# Patient Record
Sex: Female | Born: 1945
Health system: Southern US, Community
[De-identification: ages and names within clinical notes are randomized; demographics above are authoritative.]

## PROBLEM LIST (undated history)

## (undated) DIAGNOSIS — Z803 Family history of malignant neoplasm of breast: Secondary | ICD-10-CM

## (undated) DIAGNOSIS — M1990 Unspecified osteoarthritis, unspecified site: Secondary | ICD-10-CM

## (undated) DIAGNOSIS — E785 Hyperlipidemia, unspecified: Secondary | ICD-10-CM

## (undated) DIAGNOSIS — N63 Unspecified lump in unspecified breast: Secondary | ICD-10-CM

## (undated) DIAGNOSIS — D249 Benign neoplasm of unspecified breast: Secondary | ICD-10-CM

## (undated) DIAGNOSIS — N6019 Diffuse cystic mastopathy of unspecified breast: Secondary | ICD-10-CM

## (undated) DIAGNOSIS — T7840XA Allergy, unspecified, initial encounter: Secondary | ICD-10-CM

## (undated) HISTORY — PX: EYE SURGERY: SHX253

## (undated) HISTORY — DX: Diffuse cystic mastopathy of unspecified breast: N60.19

## (undated) HISTORY — DX: Allergy, unspecified, initial encounter: T78.40XA

## (undated) HISTORY — DX: Unspecified lump in unspecified breast: N63.0

## (undated) HISTORY — DX: Hyperlipidemia, unspecified: E78.5

## (undated) HISTORY — DX: Benign neoplasm of unspecified breast: D24.9

## (undated) HISTORY — DX: Unspecified osteoarthritis, unspecified site: M19.90

## (undated) HISTORY — DX: Family history of malignant neoplasm of breast: Z80.3

---

## 1978-05-20 HISTORY — PX: TUBAL LIGATION: SHX77

## 2003-05-21 HISTORY — PX: BREAST EXCISIONAL BIOPSY: SUR124

## 2004-04-17 ENCOUNTER — Ambulatory Visit: Payer: Self-pay | Admitting: General Surgery

## 2005-03-07 ENCOUNTER — Ambulatory Visit: Payer: Self-pay | Admitting: General Surgery

## 2006-03-25 ENCOUNTER — Ambulatory Visit: Payer: Self-pay | Admitting: General Surgery

## 2006-09-18 HISTORY — PX: COLONOSCOPY: SHX174

## 2006-10-17 ENCOUNTER — Ambulatory Visit: Payer: Self-pay | Admitting: General Surgery

## 2007-03-26 ENCOUNTER — Ambulatory Visit: Payer: Self-pay | Admitting: General Surgery

## 2007-05-21 LAB — HM COLONOSCOPY: HM Colonoscopy: NORMAL

## 2008-01-11 ENCOUNTER — Other Ambulatory Visit: Payer: Self-pay

## 2008-01-11 ENCOUNTER — Emergency Department: Payer: Self-pay | Admitting: Emergency Medicine

## 2008-04-19 ENCOUNTER — Ambulatory Visit: Payer: Self-pay | Admitting: General Surgery

## 2008-09-01 DIAGNOSIS — E7800 Pure hypercholesterolemia, unspecified: Secondary | ICD-10-CM

## 2009-04-21 ENCOUNTER — Ambulatory Visit: Payer: Self-pay | Admitting: General Surgery

## 2009-05-20 DIAGNOSIS — D249 Benign neoplasm of unspecified breast: Secondary | ICD-10-CM

## 2009-05-20 HISTORY — DX: Benign neoplasm of unspecified breast: D24.9

## 2009-09-06 ENCOUNTER — Ambulatory Visit: Payer: Self-pay | Admitting: Family Medicine

## 2009-09-06 DIAGNOSIS — J309 Allergic rhinitis, unspecified: Secondary | ICD-10-CM

## 2009-09-15 DIAGNOSIS — D72819 Decreased white blood cell count, unspecified: Secondary | ICD-10-CM

## 2009-12-18 ENCOUNTER — Ambulatory Visit: Payer: Self-pay | Admitting: Internal Medicine

## 2010-01-12 ENCOUNTER — Ambulatory Visit: Payer: Self-pay | Admitting: Internal Medicine

## 2010-01-18 ENCOUNTER — Ambulatory Visit: Payer: Self-pay | Admitting: Internal Medicine

## 2010-04-23 ENCOUNTER — Ambulatory Visit: Payer: Self-pay | Admitting: General Surgery

## 2011-01-31 ENCOUNTER — Emergency Department: Payer: Self-pay | Admitting: Emergency Medicine

## 2011-05-21 HISTORY — PX: BREAST BIOPSY: SHX20

## 2011-06-24 ENCOUNTER — Ambulatory Visit: Payer: Self-pay | Admitting: General Surgery

## 2011-06-24 DIAGNOSIS — Z1231 Encounter for screening mammogram for malignant neoplasm of breast: Secondary | ICD-10-CM | POA: Diagnosis not present

## 2011-07-02 DIAGNOSIS — N63 Unspecified lump in unspecified breast: Secondary | ICD-10-CM | POA: Diagnosis not present

## 2011-07-02 DIAGNOSIS — Z803 Family history of malignant neoplasm of breast: Secondary | ICD-10-CM | POA: Diagnosis not present

## 2011-07-02 DIAGNOSIS — N6009 Solitary cyst of unspecified breast: Secondary | ICD-10-CM | POA: Diagnosis not present

## 2011-07-02 DIAGNOSIS — N6019 Diffuse cystic mastopathy of unspecified breast: Secondary | ICD-10-CM | POA: Diagnosis not present

## 2011-09-03 ENCOUNTER — Ambulatory Visit: Payer: Self-pay | Admitting: Family Medicine

## 2011-09-03 DIAGNOSIS — N63 Unspecified lump in unspecified breast: Secondary | ICD-10-CM | POA: Diagnosis not present

## 2011-09-03 DIAGNOSIS — M25869 Other specified joint disorders, unspecified knee: Secondary | ICD-10-CM | POA: Diagnosis not present

## 2011-09-03 DIAGNOSIS — M5030 Other cervical disc degeneration, unspecified cervical region: Secondary | ICD-10-CM | POA: Diagnosis not present

## 2011-09-03 DIAGNOSIS — H43399 Other vitreous opacities, unspecified eye: Secondary | ICD-10-CM | POA: Diagnosis not present

## 2011-09-03 DIAGNOSIS — R42 Dizziness and giddiness: Secondary | ICD-10-CM | POA: Diagnosis not present

## 2011-09-03 DIAGNOSIS — M12269 Villonodular synovitis (pigmented), unspecified knee: Secondary | ICD-10-CM | POA: Diagnosis not present

## 2011-09-03 DIAGNOSIS — M898X9 Other specified disorders of bone, unspecified site: Secondary | ICD-10-CM | POA: Diagnosis not present

## 2011-09-03 DIAGNOSIS — M1710 Unilateral primary osteoarthritis, unspecified knee: Secondary | ICD-10-CM | POA: Diagnosis not present

## 2011-09-03 DIAGNOSIS — M25569 Pain in unspecified knee: Secondary | ICD-10-CM | POA: Diagnosis not present

## 2011-09-11 DIAGNOSIS — M1710 Unilateral primary osteoarthritis, unspecified knee: Secondary | ICD-10-CM | POA: Diagnosis not present

## 2011-09-11 DIAGNOSIS — M238X9 Other internal derangements of unspecified knee: Secondary | ICD-10-CM | POA: Diagnosis not present

## 2011-09-11 DIAGNOSIS — M25569 Pain in unspecified knee: Secondary | ICD-10-CM | POA: Diagnosis not present

## 2011-09-11 DIAGNOSIS — IMO0002 Reserved for concepts with insufficient information to code with codable children: Secondary | ICD-10-CM | POA: Diagnosis not present

## 2011-09-11 DIAGNOSIS — M6580 Other synovitis and tenosynovitis, unspecified site: Secondary | ICD-10-CM | POA: Diagnosis not present

## 2011-09-17 DIAGNOSIS — J019 Acute sinusitis, unspecified: Secondary | ICD-10-CM | POA: Diagnosis not present

## 2011-10-02 DIAGNOSIS — M238X9 Other internal derangements of unspecified knee: Secondary | ICD-10-CM | POA: Diagnosis not present

## 2011-10-02 DIAGNOSIS — IMO0002 Reserved for concepts with insufficient information to code with codable children: Secondary | ICD-10-CM | POA: Diagnosis not present

## 2011-10-02 DIAGNOSIS — M1710 Unilateral primary osteoarthritis, unspecified knee: Secondary | ICD-10-CM | POA: Diagnosis not present

## 2011-10-02 DIAGNOSIS — M6580 Other synovitis and tenosynovitis, unspecified site: Secondary | ICD-10-CM | POA: Diagnosis not present

## 2011-10-02 DIAGNOSIS — M25569 Pain in unspecified knee: Secondary | ICD-10-CM | POA: Diagnosis not present

## 2011-11-26 DIAGNOSIS — R42 Dizziness and giddiness: Secondary | ICD-10-CM | POA: Diagnosis not present

## 2011-11-26 DIAGNOSIS — H43399 Other vitreous opacities, unspecified eye: Secondary | ICD-10-CM | POA: Diagnosis not present

## 2011-11-26 DIAGNOSIS — E7800 Pure hypercholesterolemia, unspecified: Secondary | ICD-10-CM | POA: Diagnosis not present

## 2011-11-26 DIAGNOSIS — Z0000 Encounter for general adult medical examination without abnormal findings: Secondary | ICD-10-CM | POA: Diagnosis not present

## 2011-11-26 DIAGNOSIS — Z01419 Encounter for gynecological examination (general) (routine) without abnormal findings: Secondary | ICD-10-CM | POA: Diagnosis not present

## 2011-11-26 DIAGNOSIS — R61 Generalized hyperhidrosis: Secondary | ICD-10-CM | POA: Diagnosis not present

## 2011-11-26 DIAGNOSIS — Z23 Encounter for immunization: Secondary | ICD-10-CM | POA: Diagnosis not present

## 2011-11-26 LAB — HM PAP SMEAR: HM PAP: NEGATIVE

## 2011-12-05 DIAGNOSIS — T148 Other injury of unspecified body region: Secondary | ICD-10-CM | POA: Diagnosis not present

## 2011-12-05 DIAGNOSIS — R42 Dizziness and giddiness: Secondary | ICD-10-CM | POA: Diagnosis not present

## 2011-12-05 DIAGNOSIS — H43399 Other vitreous opacities, unspecified eye: Secondary | ICD-10-CM | POA: Diagnosis not present

## 2011-12-05 DIAGNOSIS — Z23 Encounter for immunization: Secondary | ICD-10-CM | POA: Diagnosis not present

## 2011-12-05 DIAGNOSIS — W57XXXA Bitten or stung by nonvenomous insect and other nonvenomous arthropods, initial encounter: Secondary | ICD-10-CM | POA: Diagnosis not present

## 2012-01-06 DIAGNOSIS — Z803 Family history of malignant neoplasm of breast: Secondary | ICD-10-CM | POA: Diagnosis not present

## 2012-01-06 DIAGNOSIS — N63 Unspecified lump in unspecified breast: Secondary | ICD-10-CM | POA: Diagnosis not present

## 2012-02-11 DIAGNOSIS — Z23 Encounter for immunization: Secondary | ICD-10-CM | POA: Diagnosis not present

## 2012-03-25 DIAGNOSIS — J019 Acute sinusitis, unspecified: Secondary | ICD-10-CM | POA: Diagnosis not present

## 2012-03-25 DIAGNOSIS — M7150 Other bursitis, not elsewhere classified, unspecified site: Secondary | ICD-10-CM | POA: Diagnosis not present

## 2012-03-25 DIAGNOSIS — M19019 Primary osteoarthritis, unspecified shoulder: Secondary | ICD-10-CM | POA: Diagnosis not present

## 2012-03-25 DIAGNOSIS — M25519 Pain in unspecified shoulder: Secondary | ICD-10-CM | POA: Diagnosis not present

## 2012-05-18 DIAGNOSIS — R209 Unspecified disturbances of skin sensation: Secondary | ICD-10-CM | POA: Diagnosis not present

## 2012-05-18 DIAGNOSIS — R61 Generalized hyperhidrosis: Secondary | ICD-10-CM | POA: Diagnosis not present

## 2012-05-18 DIAGNOSIS — R42 Dizziness and giddiness: Secondary | ICD-10-CM | POA: Diagnosis not present

## 2012-05-18 DIAGNOSIS — H43399 Other vitreous opacities, unspecified eye: Secondary | ICD-10-CM | POA: Diagnosis not present

## 2012-05-25 DIAGNOSIS — M25529 Pain in unspecified elbow: Secondary | ICD-10-CM | POA: Diagnosis not present

## 2012-05-25 DIAGNOSIS — R209 Unspecified disturbances of skin sensation: Secondary | ICD-10-CM | POA: Diagnosis not present

## 2012-05-25 DIAGNOSIS — M1990 Unspecified osteoarthritis, unspecified site: Secondary | ICD-10-CM | POA: Diagnosis not present

## 2012-05-25 DIAGNOSIS — M719 Bursopathy, unspecified: Secondary | ICD-10-CM | POA: Diagnosis not present

## 2012-05-25 DIAGNOSIS — M67919 Unspecified disorder of synovium and tendon, unspecified shoulder: Secondary | ICD-10-CM | POA: Diagnosis not present

## 2012-05-26 DIAGNOSIS — R209 Unspecified disturbances of skin sensation: Secondary | ICD-10-CM | POA: Diagnosis not present

## 2012-05-26 DIAGNOSIS — M79609 Pain in unspecified limb: Secondary | ICD-10-CM | POA: Diagnosis not present

## 2012-06-24 ENCOUNTER — Ambulatory Visit: Payer: Self-pay | Admitting: General Surgery

## 2012-06-24 DIAGNOSIS — Z1231 Encounter for screening mammogram for malignant neoplasm of breast: Secondary | ICD-10-CM | POA: Diagnosis not present

## 2012-06-24 DIAGNOSIS — K5289 Other specified noninfective gastroenteritis and colitis: Secondary | ICD-10-CM | POA: Diagnosis not present

## 2012-07-08 DIAGNOSIS — Z803 Family history of malignant neoplasm of breast: Secondary | ICD-10-CM | POA: Diagnosis not present

## 2012-09-14 DIAGNOSIS — M1710 Unilateral primary osteoarthritis, unspecified knee: Secondary | ICD-10-CM | POA: Diagnosis not present

## 2012-11-23 ENCOUNTER — Encounter: Payer: Self-pay | Admitting: *Deleted

## 2012-11-27 DIAGNOSIS — D72819 Decreased white blood cell count, unspecified: Secondary | ICD-10-CM | POA: Diagnosis not present

## 2012-11-27 DIAGNOSIS — Z1159 Encounter for screening for other viral diseases: Secondary | ICD-10-CM | POA: Diagnosis not present

## 2012-11-27 DIAGNOSIS — Z0000 Encounter for general adult medical examination without abnormal findings: Secondary | ICD-10-CM | POA: Diagnosis not present

## 2012-11-27 DIAGNOSIS — E669 Obesity, unspecified: Secondary | ICD-10-CM | POA: Diagnosis not present

## 2012-11-27 DIAGNOSIS — R319 Hematuria, unspecified: Secondary | ICD-10-CM | POA: Diagnosis not present

## 2012-11-27 DIAGNOSIS — M1710 Unilateral primary osteoarthritis, unspecified knee: Secondary | ICD-10-CM | POA: Diagnosis not present

## 2012-11-27 DIAGNOSIS — E7800 Pure hypercholesterolemia, unspecified: Secondary | ICD-10-CM | POA: Diagnosis not present

## 2012-12-03 ENCOUNTER — Ambulatory Visit: Payer: Self-pay | Admitting: Family Medicine

## 2012-12-03 DIAGNOSIS — M899 Disorder of bone, unspecified: Secondary | ICD-10-CM | POA: Diagnosis not present

## 2012-12-03 DIAGNOSIS — M949 Disorder of cartilage, unspecified: Secondary | ICD-10-CM | POA: Diagnosis not present

## 2012-12-03 LAB — HM DEXA SCAN

## 2013-02-04 DIAGNOSIS — Z23 Encounter for immunization: Secondary | ICD-10-CM | POA: Diagnosis not present

## 2013-05-03 LAB — HM MAMMOGRAPHY

## 2013-06-30 ENCOUNTER — Ambulatory Visit: Payer: Self-pay | Admitting: General Surgery

## 2013-06-30 ENCOUNTER — Encounter: Payer: Self-pay | Admitting: General Surgery

## 2013-06-30 DIAGNOSIS — Z1231 Encounter for screening mammogram for malignant neoplasm of breast: Secondary | ICD-10-CM | POA: Diagnosis not present

## 2013-06-30 DIAGNOSIS — R922 Inconclusive mammogram: Secondary | ICD-10-CM | POA: Diagnosis not present

## 2013-07-07 ENCOUNTER — Encounter: Payer: Self-pay | Admitting: General Surgery

## 2013-07-07 ENCOUNTER — Ambulatory Visit (INDEPENDENT_AMBULATORY_CARE_PROVIDER_SITE_OTHER): Payer: Medicare Other | Admitting: General Surgery

## 2013-07-07 VITALS — BP 130/68 | HR 70 | Resp 12 | Ht 64.5 in | Wt 193.0 lb

## 2013-07-07 DIAGNOSIS — Z803 Family history of malignant neoplasm of breast: Secondary | ICD-10-CM | POA: Diagnosis not present

## 2013-07-07 DIAGNOSIS — N6019 Diffuse cystic mastopathy of unspecified breast: Secondary | ICD-10-CM | POA: Diagnosis not present

## 2013-07-07 NOTE — Progress Notes (Signed)
Patient ID: Sonya Wade, female   DOB: [birth year], 68 y.o.   MRN: [id number]  Chief Complaint  Patient presents with  . Follow-up    mammogram    HPI Sonya Wade is a 68 y.o. female.  who presents for anual breast evaluation. The most recent mammogram was done on 06-30-13. She has a known history of fibrocystic breast disease. Patient does perform regular self breast checks and gets regular mammograms done.  No new breast issues.  HPI  Past Medical History  Diagnosis Date  . Arthritis   . Benign neoplasm of breast 2011  . Diffuse cystic mastopathy   . Lump or mass in breast   . Special screening for malignant neoplasms, colon   . Obesity, unspecified   . Family history of malignant neoplasm of breast   . Hyperlipidemia   . Sinus problem     Past Surgical History  Procedure Laterality Date  . Tubal ligation  1980  . Colonoscopy  May 2008    Dr. Jamal Collin  . Breast biopsy Left 2013    core, apocine cyst  . Breast biopsy Left 2005    intraductal papilloma    Family History  Problem Relation Age of Onset  . Cancer Mother     breast    Social History History  Substance Use Topics  . Smoking status: Never Smoker   . Smokeless tobacco: Not on file  . Alcohol Use: No    No Known Allergies  Current Outpatient Prescriptions  Medication Sig Dispense Refill  . aspirin 81 MG tablet Take 81 mg by mouth daily.      Marland Kitchen Bioflavonoid Products (BIOFLEX PO) Take 2 tablets by mouth daily.      . Multiple Vitamin (MULTIVITAMIN) tablet Take 1 tablet by mouth daily.      . Omega-3 Fatty Acids (FISH OIL) 1000 MG CAPS Take by mouth daily.       No current facility-administered medications for this visit.    Review of Systems Review of Systems  Constitutional: Negative.   Respiratory: Negative.   Cardiovascular: Negative.     Blood pressure 130/68, pulse 70, resp. rate 12, height 5' 4.5" (1.638 m), weight 193 lb (87.544 kg).  Physical Exam Physical Exam   Constitutional: She is oriented to person, place, and time. She appears well-developed and well-nourished.  Eyes: No scleral icterus.  Neck: Neck supple.  Cardiovascular: Normal rate, regular rhythm and normal heart sounds.   Pulmonary/Chest: Effort normal and breath sounds normal. Right breast exhibits no inverted nipple, no mass, no nipple discharge, no skin change and no tenderness. Left breast exhibits no inverted nipple, no mass, no nipple discharge, no skin change and no tenderness.  Lymphadenopathy:    She has no cervical adenopathy.    She has no axillary adenopathy.  Neurological: She is alert and oriented to person, place, and time.  Skin: Skin is warm.    Data Reviewed Mammogram reviewed and stable.  Assessment    Stable physical exam.    Plan    Follow up ion one year with bilateral screening mammogram and office visit.        SANKAR,SEEPLAPUTHUR G 07/07/2013, 11:09 AM

## 2013-08-28 DIAGNOSIS — R209 Unspecified disturbances of skin sensation: Secondary | ICD-10-CM | POA: Diagnosis not present

## 2013-08-28 DIAGNOSIS — Z8249 Family history of ischemic heart disease and other diseases of the circulatory system: Secondary | ICD-10-CM | POA: Diagnosis not present

## 2013-08-28 DIAGNOSIS — H538 Other visual disturbances: Secondary | ICD-10-CM | POA: Diagnosis not present

## 2013-08-28 DIAGNOSIS — I498 Other specified cardiac arrhythmias: Secondary | ICD-10-CM | POA: Diagnosis not present

## 2013-08-28 DIAGNOSIS — N179 Acute kidney failure, unspecified: Secondary | ICD-10-CM | POA: Diagnosis not present

## 2013-08-28 DIAGNOSIS — Z7982 Long term (current) use of aspirin: Secondary | ICD-10-CM | POA: Diagnosis not present

## 2013-08-28 DIAGNOSIS — R0602 Shortness of breath: Secondary | ICD-10-CM | POA: Diagnosis not present

## 2013-08-28 DIAGNOSIS — E876 Hypokalemia: Secondary | ICD-10-CM | POA: Diagnosis not present

## 2013-08-28 DIAGNOSIS — R002 Palpitations: Secondary | ICD-10-CM | POA: Diagnosis not present

## 2013-08-28 DIAGNOSIS — F411 Generalized anxiety disorder: Secondary | ICD-10-CM | POA: Diagnosis not present

## 2013-08-28 DIAGNOSIS — E785 Hyperlipidemia, unspecified: Secondary | ICD-10-CM | POA: Diagnosis not present

## 2013-08-28 DIAGNOSIS — N289 Disorder of kidney and ureter, unspecified: Secondary | ICD-10-CM | POA: Diagnosis not present

## 2013-08-28 DIAGNOSIS — Z79899 Other long term (current) drug therapy: Secondary | ICD-10-CM | POA: Diagnosis not present

## 2013-08-28 DIAGNOSIS — R072 Precordial pain: Secondary | ICD-10-CM | POA: Diagnosis not present

## 2013-08-28 DIAGNOSIS — R079 Chest pain, unspecified: Secondary | ICD-10-CM | POA: Diagnosis not present

## 2013-08-28 DIAGNOSIS — I200 Unstable angina: Secondary | ICD-10-CM | POA: Diagnosis not present

## 2013-08-28 DIAGNOSIS — R52 Pain, unspecified: Secondary | ICD-10-CM | POA: Diagnosis not present

## 2013-08-28 LAB — BASIC METABOLIC PANEL
Anion Gap: 5 — ABNORMAL LOW (ref 7–16)
BUN: 25 mg/dL — ABNORMAL HIGH (ref 7–18)
CHLORIDE: 109 mmol/L — AB (ref 98–107)
CO2: 27 mmol/L (ref 21–32)
CREATININE: 1.43 mg/dL — AB (ref 0.60–1.30)
Calcium, Total: 8.9 mg/dL (ref 8.5–10.1)
EGFR (African American): 44 — ABNORMAL LOW
GFR CALC NON AF AMER: 38 — AB
Glucose: 135 mg/dL — ABNORMAL HIGH (ref 65–99)
Osmolality: 288 (ref 275–301)
Potassium: 3.2 mmol/L — ABNORMAL LOW (ref 3.5–5.1)
Sodium: 141 mmol/L (ref 136–145)

## 2013-08-28 LAB — CBC
HCT: 41.7 % (ref 35.0–47.0)
HGB: 13.3 g/dL (ref 12.0–16.0)
MCH: 26 pg (ref 26.0–34.0)
MCHC: 32 g/dL (ref 32.0–36.0)
MCV: 81 fL (ref 80–100)
Platelet: 208 10*3/uL (ref 150–440)
RBC: 5.13 10*6/uL (ref 3.80–5.20)
RDW: 14 % (ref 11.5–14.5)
WBC: 6.2 10*3/uL (ref 3.6–11.0)

## 2013-08-28 LAB — TROPONIN I: TROPONIN-I: 0.05 ng/mL

## 2013-08-29 ENCOUNTER — Observation Stay: Payer: Self-pay | Admitting: Student

## 2013-08-29 DIAGNOSIS — R079 Chest pain, unspecified: Secondary | ICD-10-CM | POA: Diagnosis not present

## 2013-08-29 DIAGNOSIS — R52 Pain, unspecified: Secondary | ICD-10-CM | POA: Diagnosis not present

## 2013-08-29 DIAGNOSIS — E876 Hypokalemia: Secondary | ICD-10-CM | POA: Diagnosis not present

## 2013-08-29 DIAGNOSIS — F411 Generalized anxiety disorder: Secondary | ICD-10-CM | POA: Diagnosis not present

## 2013-08-29 DIAGNOSIS — R072 Precordial pain: Secondary | ICD-10-CM | POA: Diagnosis not present

## 2013-08-29 DIAGNOSIS — I219 Acute myocardial infarction, unspecified: Secondary | ICD-10-CM | POA: Diagnosis not present

## 2013-08-29 DIAGNOSIS — N179 Acute kidney failure, unspecified: Secondary | ICD-10-CM | POA: Diagnosis not present

## 2013-08-29 DIAGNOSIS — R002 Palpitations: Secondary | ICD-10-CM | POA: Diagnosis not present

## 2013-08-29 DIAGNOSIS — N289 Disorder of kidney and ureter, unspecified: Secondary | ICD-10-CM | POA: Diagnosis not present

## 2013-08-29 DIAGNOSIS — R209 Unspecified disturbances of skin sensation: Secondary | ICD-10-CM | POA: Diagnosis not present

## 2013-08-29 LAB — CK-MB
CK-MB: 1.1 ng/mL (ref 0.5–3.6)
CK-MB: 1.3 ng/mL (ref 0.5–3.6)
CK-MB: 1.5 ng/mL (ref 0.5–3.6)

## 2013-08-29 LAB — CBC WITH DIFFERENTIAL/PLATELET
BASOS ABS: 0.1 10*3/uL (ref 0.0–0.1)
Basophil %: 1.1 %
EOS PCT: 1.3 %
Eosinophil #: 0.1 10*3/uL (ref 0.0–0.7)
HCT: 40 % (ref 35.0–47.0)
HGB: 12.8 g/dL (ref 12.0–16.0)
Lymphocyte #: 1.7 10*3/uL (ref 1.0–3.6)
Lymphocyte %: 33.4 %
MCH: 26.3 pg (ref 26.0–34.0)
MCHC: 31.9 g/dL — ABNORMAL LOW (ref 32.0–36.0)
MCV: 83 fL (ref 80–100)
MONO ABS: 0.5 x10 3/mm (ref 0.2–0.9)
MONOS PCT: 10.8 %
Neutrophil #: 2.7 10*3/uL (ref 1.4–6.5)
Neutrophil %: 53.4 %
PLATELETS: 207 10*3/uL (ref 150–440)
RBC: 4.85 10*6/uL (ref 3.80–5.20)
RDW: 13.9 % (ref 11.5–14.5)
WBC: 5 10*3/uL (ref 3.6–11.0)

## 2013-08-29 LAB — BASIC METABOLIC PANEL
Anion Gap: 6 — ABNORMAL LOW (ref 7–16)
BUN: 29 mg/dL — AB (ref 7–18)
CO2: 28 mmol/L (ref 21–32)
CREATININE: 0.92 mg/dL (ref 0.60–1.30)
Calcium, Total: 8.7 mg/dL (ref 8.5–10.1)
Chloride: 108 mmol/L — ABNORMAL HIGH (ref 98–107)
EGFR (African American): >60
GLUCOSE: 107 mg/dL — AB (ref 65–99)
Osmolality: 289 (ref 275–301)
POTASSIUM: 3.8 mmol/L (ref 3.5–5.1)
Sodium: 142 mmol/L (ref 136–145)

## 2013-08-29 LAB — TSH: Thyroid Stimulating Horm: 1.01 u[IU]/mL

## 2013-08-29 LAB — TROPONIN I
Troponin-I: 0.08 ng/mL — ABNORMAL HIGH
Troponin-I: 0.03 ng/mL
Troponin-I: <0.02 ng/mL
Troponin-I: 0.02 ng/mL

## 2013-08-29 LAB — CK TOTAL AND CKMB (NOT AT ARMC)
CK, Total: 116 U/L
CK, Total: 110 U/L
CK, Total: 104 U/L
CK-MB: 1.2 ng/mL (ref 0.5–3.6)
CK-MB: 1.3 ng/mL (ref 0.5–3.6)
CK-MB: 1.1 ng/mL (ref 0.5–3.6)

## 2013-08-29 LAB — HEMOGLOBIN A1C: HEMOGLOBIN A1C: 6.2 % (ref 4.2–6.3)

## 2013-08-30 DIAGNOSIS — R002 Palpitations: Secondary | ICD-10-CM | POA: Diagnosis not present

## 2013-08-30 DIAGNOSIS — E876 Hypokalemia: Secondary | ICD-10-CM | POA: Diagnosis not present

## 2013-08-30 DIAGNOSIS — I219 Acute myocardial infarction, unspecified: Secondary | ICD-10-CM | POA: Diagnosis not present

## 2013-08-30 DIAGNOSIS — F411 Generalized anxiety disorder: Secondary | ICD-10-CM | POA: Diagnosis not present

## 2013-08-30 DIAGNOSIS — N289 Disorder of kidney and ureter, unspecified: Secondary | ICD-10-CM | POA: Diagnosis not present

## 2013-08-30 DIAGNOSIS — R072 Precordial pain: Secondary | ICD-10-CM | POA: Diagnosis not present

## 2013-08-30 DIAGNOSIS — R079 Chest pain, unspecified: Secondary | ICD-10-CM | POA: Diagnosis not present

## 2013-08-30 LAB — CBC WITH DIFFERENTIAL/PLATELET
BASOS PCT: 0.7 %
Basophil #: 0 10*3/uL (ref 0.0–0.1)
EOS ABS: 0.1 10*3/uL (ref 0.0–0.7)
Eosinophil %: 2.3 %
HCT: 40.6 % (ref 35.0–47.0)
HGB: 13 g/dL (ref 12.0–16.0)
LYMPHS ABS: 2.2 10*3/uL (ref 1.0–3.6)
LYMPHS PCT: 54.3 %
MCH: 26.5 pg (ref 26.0–34.0)
MCHC: 31.9 g/dL — ABNORMAL LOW (ref 32.0–36.0)
MCV: 83 fL (ref 80–100)
MONO ABS: 0.5 x10 3/mm (ref 0.2–0.9)
MONOS PCT: 11.5 %
Neutrophil #: 1.2 10*3/uL — ABNORMAL LOW (ref 1.4–6.5)
Neutrophil %: 31.2 %
Platelet: 191 10*3/uL (ref 150–440)
RBC: 4.9 10*6/uL (ref 3.80–5.20)
RDW: 13.7 % (ref 11.5–14.5)
WBC: 4 10*3/uL (ref 3.6–11.0)

## 2013-08-30 LAB — LIPID PANEL
Cholesterol: 234 mg/dL — ABNORMAL HIGH (ref 0–200)
HDL Cholesterol: 40 mg/dL (ref 40–60)
LDL CHOLESTEROL, CALC: 131 mg/dL — AB (ref 0–100)
Triglycerides: 313 mg/dL — ABNORMAL HIGH (ref 0–200)
VLDL CHOLESTEROL, CALC: 63 mg/dL — AB (ref 5–40)

## 2013-08-30 LAB — BASIC METABOLIC PANEL
Anion Gap: 7 (ref 7–16)
BUN: 16 mg/dL (ref 7–18)
CALCIUM: 9 mg/dL (ref 8.5–10.1)
Chloride: 107 mmol/L (ref 98–107)
Co2: 27 mmol/L (ref 21–32)
Creatinine: 0.97 mg/dL (ref 0.60–1.30)
EGFR (Non-African Amer.): >60
Glucose: 94 mg/dL (ref 65–99)
Osmolality: 282 (ref 275–301)
Potassium: 3.8 mmol/L (ref 3.5–5.1)
Sodium: 141 mmol/L (ref 136–145)

## 2013-09-03 DIAGNOSIS — R002 Palpitations: Secondary | ICD-10-CM | POA: Diagnosis not present

## 2013-09-03 DIAGNOSIS — E785 Hyperlipidemia, unspecified: Secondary | ICD-10-CM | POA: Diagnosis not present

## 2013-09-03 DIAGNOSIS — R0602 Shortness of breath: Secondary | ICD-10-CM | POA: Diagnosis not present

## 2013-09-09 DIAGNOSIS — R319 Hematuria, unspecified: Secondary | ICD-10-CM | POA: Diagnosis not present

## 2013-09-29 DIAGNOSIS — Z1331 Encounter for screening for depression: Secondary | ICD-10-CM | POA: Diagnosis not present

## 2013-09-29 DIAGNOSIS — R32 Unspecified urinary incontinence: Secondary | ICD-10-CM | POA: Diagnosis not present

## 2013-09-29 DIAGNOSIS — Z0000 Encounter for general adult medical examination without abnormal findings: Secondary | ICD-10-CM | POA: Diagnosis not present

## 2013-09-29 DIAGNOSIS — E7800 Pure hypercholesterolemia, unspecified: Secondary | ICD-10-CM | POA: Diagnosis not present

## 2013-09-29 DIAGNOSIS — Z1342 Encounter for screening for global developmental delays (milestones): Secondary | ICD-10-CM | POA: Diagnosis not present

## 2013-09-29 DIAGNOSIS — E860 Dehydration: Secondary | ICD-10-CM | POA: Diagnosis not present

## 2013-09-29 DIAGNOSIS — Z1330 Encounter for screening examination for mental health and behavioral disorders, unspecified: Secondary | ICD-10-CM | POA: Diagnosis not present

## 2013-10-08 DIAGNOSIS — R319 Hematuria, unspecified: Secondary | ICD-10-CM | POA: Diagnosis not present

## 2013-10-08 DIAGNOSIS — E7800 Pure hypercholesterolemia, unspecified: Secondary | ICD-10-CM | POA: Diagnosis not present

## 2013-10-08 DIAGNOSIS — Z1342 Encounter for screening for global developmental delays (milestones): Secondary | ICD-10-CM | POA: Diagnosis not present

## 2013-10-08 DIAGNOSIS — Z1330 Encounter for screening examination for mental health and behavioral disorders, unspecified: Secondary | ICD-10-CM | POA: Diagnosis not present

## 2013-10-08 DIAGNOSIS — Z1331 Encounter for screening for depression: Secondary | ICD-10-CM | POA: Diagnosis not present

## 2013-10-08 DIAGNOSIS — Z0000 Encounter for general adult medical examination without abnormal findings: Secondary | ICD-10-CM | POA: Diagnosis not present

## 2013-10-08 DIAGNOSIS — F341 Dysthymic disorder: Secondary | ICD-10-CM | POA: Diagnosis not present

## 2013-10-22 DIAGNOSIS — R319 Hematuria, unspecified: Secondary | ICD-10-CM | POA: Diagnosis not present

## 2013-11-16 DIAGNOSIS — M1990 Unspecified osteoarthritis, unspecified site: Secondary | ICD-10-CM | POA: Insufficient documentation

## 2013-11-16 DIAGNOSIS — M1710 Unilateral primary osteoarthritis, unspecified knee: Secondary | ICD-10-CM | POA: Diagnosis not present

## 2013-12-03 DIAGNOSIS — Z1342 Encounter for screening for global developmental delays (milestones): Secondary | ICD-10-CM | POA: Diagnosis not present

## 2013-12-03 DIAGNOSIS — Z0000 Encounter for general adult medical examination without abnormal findings: Secondary | ICD-10-CM | POA: Diagnosis not present

## 2013-12-03 DIAGNOSIS — Z1331 Encounter for screening for depression: Secondary | ICD-10-CM | POA: Diagnosis not present

## 2013-12-03 DIAGNOSIS — J309 Allergic rhinitis, unspecified: Secondary | ICD-10-CM | POA: Diagnosis not present

## 2013-12-03 DIAGNOSIS — R209 Unspecified disturbances of skin sensation: Secondary | ICD-10-CM | POA: Diagnosis not present

## 2013-12-03 DIAGNOSIS — Z1330 Encounter for screening examination for mental health and behavioral disorders, unspecified: Secondary | ICD-10-CM | POA: Diagnosis not present

## 2013-12-03 DIAGNOSIS — E7800 Pure hypercholesterolemia, unspecified: Secondary | ICD-10-CM | POA: Diagnosis not present

## 2013-12-06 DIAGNOSIS — E7800 Pure hypercholesterolemia, unspecified: Secondary | ICD-10-CM | POA: Diagnosis not present

## 2013-12-06 DIAGNOSIS — J309 Allergic rhinitis, unspecified: Secondary | ICD-10-CM | POA: Diagnosis not present

## 2013-12-06 DIAGNOSIS — R209 Unspecified disturbances of skin sensation: Secondary | ICD-10-CM | POA: Diagnosis not present

## 2013-12-06 LAB — CBC AND DIFFERENTIAL
HEMATOCRIT: 40 % (ref 36–46)
Hemoglobin: 13 g/dL (ref 12.0–16.0)
Platelets: 206 10*3/uL (ref 150–399)
WBC: 5.1 10*3/mL

## 2013-12-06 LAB — HEPATIC FUNCTION PANEL
ALT: 12 U/L (ref 7–35)
AST: 15 U/L (ref 13–35)

## 2013-12-06 LAB — BASIC METABOLIC PANEL
BUN: 24 mg/dL — AB (ref 4–21)
CREATININE: 0.9 mg/dL (ref 0.5–1.1)
Glucose: 97 mg/dL
Potassium: 4.2 mmol/L (ref 3.4–5.3)
Sodium: 141 mmol/L (ref 137–147)

## 2013-12-06 LAB — LIPID PANEL
CHOLESTEROL: 206 mg/dL — AB (ref 0–200)
HDL: 73 mg/dL — AB (ref 35–70)
LDL Cholesterol: 113 mg/dL
TRIGLYCERIDES: 102 mg/dL (ref 40–160)

## 2013-12-06 LAB — TSH: TSH: 1.45 u[IU]/mL (ref 0.41–5.90)

## 2013-12-17 DIAGNOSIS — R42 Dizziness and giddiness: Secondary | ICD-10-CM | POA: Diagnosis not present

## 2013-12-17 DIAGNOSIS — E7800 Pure hypercholesterolemia, unspecified: Secondary | ICD-10-CM | POA: Diagnosis not present

## 2013-12-17 DIAGNOSIS — Z1330 Encounter for screening examination for mental health and behavioral disorders, unspecified: Secondary | ICD-10-CM | POA: Diagnosis not present

## 2013-12-17 DIAGNOSIS — Z0000 Encounter for general adult medical examination without abnormal findings: Secondary | ICD-10-CM | POA: Diagnosis not present

## 2013-12-17 DIAGNOSIS — J309 Allergic rhinitis, unspecified: Secondary | ICD-10-CM | POA: Diagnosis not present

## 2013-12-17 DIAGNOSIS — Z1342 Encounter for screening for global developmental delays (milestones): Secondary | ICD-10-CM | POA: Diagnosis not present

## 2013-12-17 DIAGNOSIS — Z1331 Encounter for screening for depression: Secondary | ICD-10-CM | POA: Diagnosis not present

## 2014-02-18 DIAGNOSIS — M1710 Unilateral primary osteoarthritis, unspecified knee: Secondary | ICD-10-CM | POA: Diagnosis not present

## 2014-02-18 DIAGNOSIS — M1711 Unilateral primary osteoarthritis, right knee: Secondary | ICD-10-CM | POA: Diagnosis not present

## 2014-02-22 DIAGNOSIS — Z23 Encounter for immunization: Secondary | ICD-10-CM | POA: Diagnosis not present

## 2014-03-10 DIAGNOSIS — J309 Allergic rhinitis, unspecified: Secondary | ICD-10-CM | POA: Diagnosis not present

## 2014-03-10 DIAGNOSIS — J019 Acute sinusitis, unspecified: Secondary | ICD-10-CM | POA: Diagnosis not present

## 2014-03-21 ENCOUNTER — Encounter: Payer: Self-pay | Admitting: General Surgery

## 2014-04-22 DIAGNOSIS — J019 Acute sinusitis, unspecified: Secondary | ICD-10-CM | POA: Diagnosis not present

## 2014-04-22 DIAGNOSIS — Z1389 Encounter for screening for other disorder: Secondary | ICD-10-CM | POA: Diagnosis not present

## 2014-04-22 DIAGNOSIS — Z0000 Encounter for general adult medical examination without abnormal findings: Secondary | ICD-10-CM | POA: Diagnosis not present

## 2014-04-22 DIAGNOSIS — K219 Gastro-esophageal reflux disease without esophagitis: Secondary | ICD-10-CM | POA: Diagnosis not present

## 2014-04-22 DIAGNOSIS — Z23 Encounter for immunization: Secondary | ICD-10-CM | POA: Diagnosis not present

## 2014-05-17 DIAGNOSIS — M79672 Pain in left foot: Secondary | ICD-10-CM | POA: Diagnosis not present

## 2014-05-17 DIAGNOSIS — S92352A Displaced fracture of fifth metatarsal bone, left foot, initial encounter for closed fracture: Secondary | ICD-10-CM | POA: Diagnosis not present

## 2014-05-17 DIAGNOSIS — M7989 Other specified soft tissue disorders: Secondary | ICD-10-CM | POA: Diagnosis not present

## 2014-05-18 DIAGNOSIS — S92355A Nondisplaced fracture of fifth metatarsal bone, left foot, initial encounter for closed fracture: Secondary | ICD-10-CM | POA: Diagnosis not present

## 2014-06-08 DIAGNOSIS — S92355D Nondisplaced fracture of fifth metatarsal bone, left foot, subsequent encounter for fracture with routine healing: Secondary | ICD-10-CM | POA: Diagnosis not present

## 2014-06-29 DIAGNOSIS — S92355D Nondisplaced fracture of fifth metatarsal bone, left foot, subsequent encounter for fracture with routine healing: Secondary | ICD-10-CM | POA: Diagnosis not present

## 2014-07-01 ENCOUNTER — Encounter: Payer: Self-pay | Admitting: General Surgery

## 2014-07-01 ENCOUNTER — Ambulatory Visit: Payer: Self-pay | Admitting: General Surgery

## 2014-07-01 DIAGNOSIS — Z1231 Encounter for screening mammogram for malignant neoplasm of breast: Secondary | ICD-10-CM | POA: Diagnosis not present

## 2014-07-13 ENCOUNTER — Ambulatory Visit: Payer: Medicare Other | Admitting: General Surgery

## 2014-07-19 ENCOUNTER — Ambulatory Visit (INDEPENDENT_AMBULATORY_CARE_PROVIDER_SITE_OTHER): Payer: Medicare Other | Admitting: General Surgery

## 2014-07-19 ENCOUNTER — Encounter: Payer: Self-pay | Admitting: General Surgery

## 2014-07-19 VITALS — BP 118/74 | HR 64 | Resp 12 | Ht 64.0 in | Wt 196.0 lb

## 2014-07-19 DIAGNOSIS — Z803 Family history of malignant neoplasm of breast: Secondary | ICD-10-CM | POA: Diagnosis not present

## 2014-07-19 DIAGNOSIS — N6019 Diffuse cystic mastopathy of unspecified breast: Secondary | ICD-10-CM

## 2014-07-19 NOTE — Patient Instructions (Signed)
Patient to return in 1 year with bilateral screening mammogram. Continue self breast exams. Call office for any new breast issues or concerns.  

## 2014-07-19 NOTE — Progress Notes (Signed)
Patient ID: Sonya Wade, female   DOB: 04-27-1946, 69 y.o.   MRN: [id number]  Chief Complaint  Patient presents with  . Follow-up    mammogram    HPI Sonya Wade is a 69 y.o. female who presents for a breast evaluation. The most recent mammogram was done on 07/01/14 . Patient does perform regular self breast checks and gets regular mammograms done. The patient denies any new problems at this time.   HPI  Past Medical History  Diagnosis Date  . Arthritis   . Benign neoplasm of breast 2011  . Diffuse cystic mastopathy   . Lump or mass in breast   . Special screening for malignant neoplasms, colon   . Obesity, unspecified   . Family history of malignant neoplasm of breast   . Hyperlipidemia   . Sinus problem     Past Surgical History  Procedure Laterality Date  . Tubal ligation  1980  . Colonoscopy  May 2008    Dr. Jamal Collin  . Breast biopsy Left 2013    core, apocine cyst  . Breast biopsy Left 2005    intraductal papilloma    Family History  Problem Relation Age of Onset  . Cancer Mother     breast    Social History History  Substance Use Topics  . Smoking status: Never Smoker   . Smokeless tobacco: Not on file  . Alcohol Use: No    No Known Allergies  Current Outpatient Prescriptions  Medication Sig Dispense Refill  . aspirin 81 MG tablet Take 81 mg by mouth daily.    Marland Kitchen Bioflavonoid Products (BIOFLEX PO) Take 2 tablets by mouth daily as needed.     . Multiple Vitamin (MULTIVITAMIN) tablet Take 1 tablet by mouth daily.    . Omega-3 Fatty Acids (FISH OIL) 1000 MG CAPS Take by mouth daily.     No current facility-administered medications for this visit.    Review of Systems Review of Systems  Constitutional: Negative.   Respiratory: Negative.   Cardiovascular: Negative.     Blood pressure 118/74, pulse 64, resp. rate 12, height 5\' 4"  (1.626 m), weight 196 lb (88.905 kg).  Physical Exam Physical Exam  Constitutional: She is  oriented to person, place, and time. She appears well-developed and well-nourished.  Eyes: Conjunctivae are normal. No scleral icterus.  Neck: Neck supple.  Cardiovascular: Normal rate, regular rhythm and normal heart sounds.   Pulmonary/Chest: Effort normal and breath sounds normal. Right breast exhibits no inverted nipple, no mass, no nipple discharge, no skin change and no tenderness. Left breast exhibits no inverted nipple, no mass, no nipple discharge, no skin change and no tenderness.  Left nipple is sunk in due to prior surgery.   Abdominal: Soft. Bowel sounds are normal. There is no tenderness.  Lymphadenopathy:    She has no cervical adenopathy.    She has no axillary adenopathy.  Neurological: She is alert and oriented to person, place, and time.  Skin: Skin is warm and dry.    Data Reviewed Mammogram reviewed  Assessment    Stable exam.    Plan Patient will be asked to return to the office in one year with a bilateral screening mammogram.       SANKAR,SEEPLAPUTHUR G 07/20/2014, 8:04 AM

## 2014-09-10 NOTE — Discharge Summary (Signed)
PATIENT NAME:  Sonya Wade, Sonya Wade MR#:  [id number] DATE OF BIRTH:  1945-08-08  DATE OF ADMISSION:  08/29/2013 DATE OF DISCHARGE:  08/30/2013  CONSULTANTS: Dr. Humphrey Rolls from cardiology.   PRIMARY CARE PHYSICIAN: Dr. Margarita Rana.   CHIEF COMPLAINT: Chest discomfort, palpitations.  DISCHARGE DIAGNOSES:  1.  Chest discomfort, likely not cardiac with negative cardiac catheter and.  2.  Palpitations, possibly anxiety-related. Arrhythmia, not completely ruled out.  3.  Hyperlipidemia.   4.  Mild hypokalemia on arrival, resolved.  5.  Acute renal failure, resolved.   DISCHARGE MEDICATIONS: Fish oil 1 cap once a day, multivitamin 1 tab daily, Osteo Bi-Flex 1 tab 2 times a day as needed, Claritin 10 mg once a day as needed, fluticasone nasal spray 2 sprays each nostril once a day as needed, atorvastatin 20 mg daily, aspirin 81 mg daily.   DIET: Low fat, low cholesterol.   ACTIVITY: As tolerated.   FOLLOWUP: Please follow with PCP within 1 to 2 weeks. Please follow with cardiology on Friday at 9:00 a.m. If you have any other palpitations, chest pressure or any other complaints, call your doctor right away.   DISPOSITION: Home.   SIGNIFICANT LABORATORY, DIAGNOSTIC AND RADIOLOGICAL DATA:  Cardiac catheter. No significant coronary artery blockages. TSH 1.01. Initial troponin 0.2, next 0.05, next 0.08. The following 3 troponins were within normal limits and down-trending. CK-MB negative x 6. LDL of 131, triglycerides 313. Initial BUN 25, creatinine 1.43, sodium 141, potassium 3.2. CT head without contrast showed no acute abnormalities. Chest, PA and lateral, on admission no active cardiopulmonary disease.   HISTORY OF PRESENT ILLNESS AND HOSPITAL COURSE: For full details of H and P, please see the dictation on April 11th by Dr. Waldron Labs but, briefly, this is a pleasant 69 year old female with hyperlipidemia, who came in chest discomfort, palpitations, who felt her heart was likely going to jump out of her  chest without chest pain or pressure. She initially had a negative troponin at 0.02, then slightly going up and the third troponin was 0.08. She was also noted to have mild renal failure with creatinine of 1.43 with a BUN of 25. She had been having some diarrhea which, by the time she came into the hospital, had resolved. She was admitted to the hospitalist service with possible non-ST-elevation mid given the third troponin being positive and was started on Lovenox, aspirin. The patient's heart rate was on the lower side, which did not allow for a beta blocker. She was started on statin and cardiology saw the patient. An echocardiogram was obtained showing normal EF. She underwent cardiac cath showing no significant coronary artery disease. She has been having increased anxiety. 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 She has been having also some diarrhea which had resolved, possibly being prerenal cause for an acute renal failure which improved with IV fluids.   At this point, with the renal failure improved and she will be discharged with outpatient followup. There was no significant tachyarrhythmias, however, the patient did have bouts of sinus bradycardia, likely when she was sleeping, in the high 30s, low 40s; however, she was asymptomatic. She would be following with Dr. Humphrey Rolls as an outpatient for possible event monitor to see if  she has any significant tachycardia, SVT or fib/flutter type of rhythm. She will follow up with Dr. Humphrey Rolls on Friday. Her LDL and triglycerides were high and she was started on a statin. At this point, she will be discharged to outpatient followup.   TOTAL TIME SPENT: About 40 minutes. The patient is full  code   ____________________________ Vivien Presto, MD sa:cs D: 08/30/2013 16:15:56 ET T: 08/30/2013 20:33:15 ET JOB#: [id number]  cc: Vivien Presto, MD, <Dictator> Dr. Raynaldo Opitz, MD Vivien Presto MD ELECTRONICALLY SIGNED 09/16/2013 14:25

## 2014-09-10 NOTE — H&P (Signed)
PATIENT NAME:  Sonya Wade, MANGES MR#:  [id number] DATE OF BIRTH:  Jun 20, 1945  DATE OF ADMISSION:  08/29/2013  REFERRING PHYSICIAN:  Dr. Reita Cliche.  PRIMARY CARE PHYSICIAN: Dr Dixon Boos  CHIEF COMPLAINT:  Chest discomfort, palpitations.   HISTORY OF PRESENT ILLNESS:  This is a 69 year old female without significant past medical history, presents with complaints of chest discomfort and palpitation, reports it is left-sided, reports it is mainly as palpitation, felt like her heart was going to jump out of her chest.  She denies any chest pain or even chest pressure, , just described it that way.  As well reports some mild sweating, but she denies any nausea, any vomiting, any shortness of breath, any weakness, dizziness, the patient's EKG did not show any acute findings.  The patient had negative troponins x 2 for the second repeat troponin was slightly elevated as initial set was less than 0.02, repeat one was 0.05, ED were concerned about that so they requested the patient to be admitted, as well, the patient had mild elevation of her creatinine at 1.43 and BUN of 25, unclear what is her baseline, currently, the patient denies any chest pain.  She denies any fever, any chills, any cough, any productive sputum, as well the patient reports during that episode she had bilateral hand tingling and numbness which currently it is resolved.  The patient was given two baby aspirin by her husband.  As well, the patient had CT head without contrast which did not show any acute findings.   PAST MEDICAL HISTORY:  None.   PAST SURGICAL HISTORY:  None.   ALLERGIES:  No known drug allergies.   HOME MEDICATIONS: 1.  Claritin 10 mg oral daily.  2.  Multivitamin 1 tablet oral daily.  3.  Fish oil 1 tablet oral daily.  4.  Fluticasone nasal two sprays each nostril as needed.  5.  Osteo Bi Flex 1 capsule 2 times a day as needed for joint pain.   FAMILY HISTORY:  The patient denies any family history of coronary  artery disease at a young age.   SOCIAL HISTORY:  Denies any smoking.  No alcohol, any illicit drug use.   REVIEW OF SYSTEMS:  CONSTITUTIONAL:  Denies fever, chills, fatigue, weakness, weight gain, weight loss.  EYES:  Denies blurry vision, double vision, inflammation, glaucoma. EARS, NOSE, THROAT:  Denies tinnitus, ear pain, hearing loss, epistaxis or discharge.  RESPIRATORY:  Denies cough, wheezing, hemoptysis, COPD.  CARDIOVASCULAR:  Denies chest pain.  Reports palpitation or discomfort.  Denies any edema or syncope.  GASTROINTESTINAL:  Denies nausea, vomiting, diarrhea, abdominal pain, hematemesis.  GENITOURINARY:  Denies dysuria, hematuria, renal colic.  ENDOCRINE:  Denies polyuria, polydipsia, heat or cold intolerance. HEMATOLOGY:  Denies anemia, easy bruising, bleeding diathesis. INTEGUMENTARY:  Denies acne, rash or skin lesion.  MUSCULOSKELETAL:  Denies any joint effusion or erythema or gout.  NEUROLOGIC:  Denies any history of CVA, TIA, seizures, ataxia, tremors, vertigo.  Reports bilateral hand paresthesia during the episodes of her chest discomfort, currently resolved.  PSYCHIATRIC:  Reports history of anxiety.  Denies bipolar disorder and insomnia.   PHYSICAL EXAMINATION: VITAL SIGNS:  Temperature 98, pulse 68, respiratory rate 16, blood pressure 107/64, saturating 96% on room air.  GENERAL:  Well-nourished female who looks comfortable in bed, in no apparent distress.  HEENT:  Head atraumatic, normocephalic.  Pupils equal, reactive to light.  Pink conjunctivae.  Anicteric sclerae.  Moist oral mucosa.  NECK:  Supple.  No thyromegaly.  No JVD.  CHEST:  Good air entry bilaterally.  No wheezing, rales, rhonchi.  CARDIOVASCULAR:  S1, S2 heard.  No rubs, murmurs or gallops.  ABDOMEN:  Soft, nontender, nondistended.  Bowel sounds present.  EXTREMITIES:  No edema.  No clubbing.  No cyanosis.  PSYCHIATRIC:  Appropriate affect.  Awake, alert x 3.  Intact judgment and insight.   NEUROLOGIC:  Cranial nerves grossly intact.  Motor five out of five.  No focal deficits.  Sensation symmetrical and intact to light touch bilaterally.  No sensory deficits.  MUSCULOSKELETAL:  No joint effusion or erythema.  SKIN:  Normal skin turgor.  Warm and dry.  LYMPHATICS:  No cervical lymphadenopathy could be appreciated.   PERTINENT LABORATORY DATA:  Glucose 135, BUN 25, creatinine 1.43, sodium 141, potassium 3.2, chloride 109, CO2 27.  Troponin less than 0.02, repeat 0.05.  White blood cells 6.2, hemoglobin 13.3, hematocrit 41.7, platelets 208.   IMAGING STUDIES:  CT head without contrast showing no acute intracranial abnormalities.  Chest x-ray:  No active cardiopulmonary disease.   ASSESSMENT AND PLAN: 1.  Chest discomfort, the patient does not have any EKG changes, so far her cardiac enzymes are negative x 2, but given the fact she had a slight bump in her troponin from less than 0.02 to 0.5 the patient will be admitted for further evaluation.  We will monitor her on telemetry.  We will consult cardiology service for further evaluation to see what kind of work-up is needed as we cannot have a stress test done on Sunday, we will give her another two baby aspirin to have a total of 324 mg of aspirin.  We will have keep her on sublingual nitroglycerin as needed.  2.  Hypokalemia.  We will replace.  3.  Mild renal failure, unclear what this patient's baseline, but we will continue with intravenous fluids.  We will recheck in a.m.  4.  Deep vein thrombosis prophylaxis.  SubQ heparin.  5.  CODE STATUS:  FULL CODE.   Time spent on admission and patient care 40 minutes.    ____________________________ Albertine Patricia, MD dse:ea D: 08/29/2013 00:01:20 ET T: 08/29/2013 00:37:00 ET JOB#: [id number]  cc: Albertine Patricia, MD, <Dictator> Masiya Claassen Graciela Husbands MD ELECTRONICALLY SIGNED 08/29/2013 2:50

## 2014-09-10 NOTE — Consult Note (Signed)
PATIENT NAME:  Sonya Wade, PAVONE MR#:  [id number] DATE OF BIRTH:  Aug 26, 1945  DATE OF CONSULTATION:  08/29/2013  CONSULTING PHYSICIAN:  Dionisio David, MD  INDICATION FOR CONSULTATION: Chest pain, palpitations.   HISTORY OF PRESENT ILLNESS: This is a 69 year old African American female with a past medical history of chest pain and palpitations who presented to the Emergency Room with an episode where she is really had left-sided chest pain fluttering in the chest. Her heart felt like it  was jumping out. She also had weakness and dizziness. Her initial troponin was negative, but the second and third troponin went up. Her BUN was high initially and creatinine was high, but it has normalized. CT of the head was negative.   PAST MEDICAL HISTORY: History of hyperlipidemia.   FAMILY HISTORY: Positive for coronary artery disease.   SOCIAL HISTORY: No history of tuberculosis or smoking.   PHYSICAL EXAMINATION: GENERAL: She is alert, oriented x 3, in no acute distress.  VITAL SIGNS: Vitals are stable.  NECK: No JVD.  LUNGS: Clear.  HEART: Regular rate and rhythm. Normal S1, S2. No audible murmur.  ABDOMEN: Soft, nontender, positive bowel sounds.  EXTREMITIES: No pedal edema.  NEUROLOGIC: She appears to be intact.   LABORATORIES AND STUDIES: EKG shows normal sinus rhythm, 86 beats per minute with ST depression in V4 to V6. Troponin has gone up from 0.02 to 0.05 and now is 0.08.  ASSESSMENT AND PLAN: The patient has acute coronary syndrome with possible non-STEMI and probably has coronary artery disease with a [place] Class III angina. Advised cardiac catheterization.   ____________________________ Dionisio David, MD sak:sg D: 08/29/2013 10:02:00 ET T: 08/29/2013 11:38:15 ET JOB#: [id number] Earlyne Iba A Cale Bethard MD ELECTRONICALLY SIGNED 09/24/2013 11:03

## 2014-09-19 ENCOUNTER — Other Ambulatory Visit: Payer: Self-pay

## 2014-09-19 DIAGNOSIS — M1710 Unilateral primary osteoarthritis, unspecified knee: Secondary | ICD-10-CM | POA: Insufficient documentation

## 2014-09-19 DIAGNOSIS — K219 Gastro-esophageal reflux disease without esophagitis: Secondary | ICD-10-CM | POA: Insufficient documentation

## 2014-09-19 DIAGNOSIS — F329 Major depressive disorder, single episode, unspecified: Secondary | ICD-10-CM | POA: Insufficient documentation

## 2014-09-19 DIAGNOSIS — E669 Obesity, unspecified: Secondary | ICD-10-CM | POA: Insufficient documentation

## 2014-09-19 DIAGNOSIS — M179 Osteoarthritis of knee, unspecified: Secondary | ICD-10-CM | POA: Insufficient documentation

## 2014-09-19 DIAGNOSIS — F419 Anxiety disorder, unspecified: Secondary | ICD-10-CM

## 2014-11-04 ENCOUNTER — Ambulatory Visit (INDEPENDENT_AMBULATORY_CARE_PROVIDER_SITE_OTHER): Payer: Medicare Other | Admitting: Family Medicine

## 2014-11-04 ENCOUNTER — Encounter: Payer: Self-pay | Admitting: Family Medicine

## 2014-11-04 DIAGNOSIS — W57XXXA Bitten or stung by nonvenomous insect and other nonvenomous arthropods, initial encounter: Secondary | ICD-10-CM

## 2014-11-04 DIAGNOSIS — M1990 Unspecified osteoarthritis, unspecified site: Secondary | ICD-10-CM | POA: Diagnosis not present

## 2014-11-04 DIAGNOSIS — E7800 Pure hypercholesterolemia, unspecified: Secondary | ICD-10-CM

## 2014-11-04 DIAGNOSIS — T148 Other injury of unspecified body region: Secondary | ICD-10-CM

## 2014-11-04 DIAGNOSIS — F418 Other specified anxiety disorders: Secondary | ICD-10-CM

## 2014-11-04 DIAGNOSIS — M25569 Pain in unspecified knee: Secondary | ICD-10-CM | POA: Insufficient documentation

## 2014-11-04 DIAGNOSIS — R634 Abnormal weight loss: Secondary | ICD-10-CM

## 2014-11-04 DIAGNOSIS — F329 Major depressive disorder, single episode, unspecified: Secondary | ICD-10-CM

## 2014-11-04 DIAGNOSIS — M25561 Pain in right knee: Secondary | ICD-10-CM

## 2014-11-04 DIAGNOSIS — F419 Anxiety disorder, unspecified: Secondary | ICD-10-CM

## 2014-11-04 DIAGNOSIS — M25562 Pain in left knee: Secondary | ICD-10-CM

## 2014-11-04 MED ORDER — PREDNISONE 5 MG PO TABS
5.0000 mg | ORAL_TABLET | Freq: Every day | ORAL | Status: DC
Start: 2014-11-04 — End: 2014-12-26

## 2014-11-04 NOTE — Progress Notes (Signed)
Subjective:    Patient ID: Sonya Wade, female    DOB: 02-06-1946, 69 y.o.   MRN: [id number]  Depression: Patient complains of depression. She complains of no symptoms. Onset was approximately 1 year ago, stable since that time.  She denies current suicidal and homicidal plan or intent.   Family history significant for no psychiatric illness.Possible organic causes contributing are: none.  Risk factors: negative life event busy lifestyle Previous treatment includes Ativan and Lexapro and none. She complains of the following side effects from the treatment: none.     Hyperlipidemia Lipid results: 12/06/2013 Total- 206; Triglycerides- 102; HDL- 73; LDL- 113. Pertinent negatives include no chest pain, focal sensory loss, focal weakness, leg pain, myalgias or shortness of breath. She is currently on no antihyperlipidemic treatment.  Anxiety Presents for follow-up visit. Patient reports no chest pain, compulsions, confusion, decreased concentration, depressed mood, dizziness, dry mouth, excessive worry, feeling of choking, hyperventilation, insomnia, irritability, malaise, muscle tension, nausea, nervous/anxious behavior, obsessions, palpitations, panic, restlessness, shortness of breath or suicidal ideas. The severity of symptoms is mild. The quality of sleep is good.   Compliance with medications is 76-100%.      Review of Systems  Constitutional: Negative for irritability.  Respiratory: Negative for shortness of breath.   Cardiovascular: Negative for chest pain and palpitations.  Gastrointestinal: Negative for nausea.  Musculoskeletal: Negative for myalgias.  Neurological: Negative for dizziness and focal weakness.  Psychiatric/Behavioral: Negative for suicidal ideas, confusion and decreased concentration. The patient is not nervous/anxious and does not have insomnia.    BP 110/68 mmHg  Pulse 64  Temp(Src) 98 F (36.7 C) (Oral)  Resp 16  Ht 5\' 3"  (1.6 m)  Wt 169 lb  (76.658 kg)  BMI 29.94 kg/m2   Patient Active Problem List   Diagnosis Date Noted  . Anxiety and depression 09/19/2014  . Arthritis of knee, degenerative 09/19/2014  . Adiposity 09/19/2014  . Esophageal reflux 09/19/2014  . Arthritis, degenerative 11/16/2013  . Family history of breast cancer 07/07/2013  . Fibrocystic breast disease 07/07/2013  . Bloodgood disease 07/07/2013  . Decreased leukocytes 09/15/2009  . Allergic rhinitis 09/06/2009  . Hypercholesteremia 09/01/2008   Past Medical History  Diagnosis Date  . Arthritis   . Benign neoplasm of breast 2011  . Diffuse cystic mastopathy   . Lump or mass in breast   . Special screening for malignant neoplasms, colon   . Obesity, unspecified   . Family history of malignant neoplasm of breast   . Hyperlipidemia   . Sinus problem    Current Outpatient Prescriptions on File Prior to Visit  Medication Sig  . aspirin 81 MG tablet Take 81 mg by mouth daily.  Marland Kitchen Bioflavonoid Products (BIOFLEX PO) Take 2 tablets by mouth daily as needed.   . Cetirizine HCl 10 MG CAPS Take by mouth.  . Cholecalciferol 1000 UNITS tablet Take by mouth.  . escitalopram (LEXAPRO) 10 MG tablet Take by mouth.  . fluticasone (FLONASE) 50 MCG/ACT nasal spray Place into the nose.  Marland Kitchen LORazepam (ATIVAN) 0.5 MG tablet Take by mouth.  . meloxicam (MOBIC) 15 MG tablet Take by mouth.  . Multiple Vitamin (MULTIVITAMIN) tablet Take 1 tablet by mouth daily.  . Omega-3 Fatty Acids (FISH OIL) 1000 MG CAPS Take by mouth daily.  Marland Kitchen omeprazole (PRILOSEC) 20 MG capsule Take by mouth.   No current facility-administered medications on file prior to visit.   No Known Allergies Past Surgical History  Procedure Laterality Date  .  Tubal ligation  1980  . Colonoscopy  May 2008    Dr. Jamal Collin  . Breast biopsy Left 2013    core, apocine cyst  . Breast biopsy Left 2005    intraductal papilloma   History   Social History  . Marital Status: Married    Spouse Name: N/A  .  Number of Children: N/A  . Years of Education: N/A   Occupational History  . Not on file.   Social History Main Topics  . Smoking status: Never Smoker   . Smokeless tobacco: Never Used  . Alcohol Use: No  . Drug Use: No  . Sexual Activity: Not on file   Other Topics Concern  . Not on file   Social History Narrative   Family History  Problem Relation Age of Onset  . Cancer Mother     breast  . Diabetes Father   . Hypertension Sister     Colon Polyps         Objective:   Physical Exam  Constitutional: She is oriented to person, place, and time. She appears well-developed and well-nourished.  Neurological: She is alert and oriented to person, place, and time.  Skin: Skin is warm, dry and intact. Lesion (multiple small firm lesions with mild erythema on abdomen, neck and under arms. ) noted.  Psychiatric: She has a normal mood and affect. Her behavior is normal. Judgment and thought content normal.   BP 110/68 mmHg  Pulse 64  Temp(Src) 98 F (36.7 C) (Oral)  Resp 16  Ht 5\' 3"  (1.6 m)  Wt 169 lb (76.658 kg)  BMI 29.94 kg/m2        Assessment & Plan:   1. Right knee pain Comes and goes, uses her husband's Prednisone when her Meloxicam does not help. Does not use it for long. Warned dangerous to use long term.  Patient will only use sparingly. Usually works after one to two doses. Understands can thin bones and suppress immune system.   - predniSONE (DELTASONE) 5 MG tablet; Take 1 tablet (5 mg total) by mouth daily with breakfast. For max of 5 days  Dispense: 30 tablet; Refill: 0  2. Hypercholesteremia Check labs. Thinks will be better with weight loss.  - Comprehensive metabolic panel - Lipid panel  3. Osteoarthritis, unspecified osteoarthritis type, unspecified site Stable.   - CBC with Differential/Platelet  4. Anxiety and depression Condition is stable. Please continue current medication and  plan of care as noted.   - TSH  5. Weight loss Doing great  with [employer]. Goal weight is 165. Wrote note today.   - CBC with Differential/Platelet  6. Bug bites Suspect cause of rash, not Zostavax.  Please call back if condition worsens or does not continue to improve.   Follow up for Wellness.      Margarita Rana, MD

## 2014-11-05 LAB — LIPID PANEL
CHOLESTEROL TOTAL: 250 mg/dL — AB (ref 100–199)
Chol/HDL Ratio: 4.5 ratio units — ABNORMAL HIGH (ref 0.0–4.4)
HDL: 56 mg/dL (ref >39)
LDL Calculated: 155 mg/dL — ABNORMAL HIGH (ref 0–99)
TRIGLYCERIDES: 196 mg/dL — AB (ref 0–149)
VLDL Cholesterol Cal: 39 mg/dL (ref 5–40)

## 2014-11-05 LAB — CBC WITH DIFFERENTIAL/PLATELET
Basophils Absolute: 0 10*3/uL (ref 0.0–0.2)
Basos: 1 %
EOS (ABSOLUTE): 0.1 10*3/uL (ref 0.0–0.4)
Eos: 1 %
HEMATOCRIT: 40.7 % (ref 34.0–46.6)
HEMOGLOBIN: 13.1 g/dL (ref 11.1–15.9)
IMMATURE GRANS (ABS): 0 10*3/uL (ref 0.0–0.1)
Immature Granulocytes: 0 %
LYMPHS: 37 %
Lymphocytes Absolute: 1.5 10*3/uL (ref 0.7–3.1)
MCH: 26.3 pg — ABNORMAL LOW (ref 26.6–33.0)
MCHC: 32.2 g/dL (ref 31.5–35.7)
MCV: 82 fL (ref 79–97)
MONOS ABS: 0.3 10*3/uL (ref 0.1–0.9)
Monocytes: 8 %
Neutrophils Absolute: 2.2 10*3/uL (ref 1.4–7.0)
Neutrophils: 53 %
Platelets: 239 10*3/uL (ref 150–379)
RBC: 4.99 x10E6/uL (ref 3.77–5.28)
RDW: 14.5 % (ref 12.3–15.4)
WBC: 4 10*3/uL (ref 3.4–10.8)

## 2014-11-05 LAB — COMPREHENSIVE METABOLIC PANEL
A/G RATIO: 1.5 (ref 1.1–2.5)
ALBUMIN: 4.3 g/dL (ref 3.6–4.8)
ALT: 10 IU/L (ref 0–32)
AST: 13 IU/L (ref 0–40)
Alkaline Phosphatase: 88 IU/L (ref 39–117)
BUN / CREAT RATIO: 20 (ref 11–26)
BUN: 14 mg/dL (ref 8–27)
Bilirubin Total: 0.2 mg/dL (ref 0.0–1.2)
CO2: 27 mmol/L (ref 18–29)
Calcium: 9.6 mg/dL (ref 8.7–10.3)
Chloride: 101 mmol/L (ref 97–108)
Creatinine, Ser: 0.7 mg/dL (ref 0.57–1.00)
GFR calc Af Amer: 103 mL/min/{1.73_m2} (ref >59)
GFR calc non Af Amer: 89 mL/min/{1.73_m2} (ref >59)
Globulin, Total: 2.8 g/dL (ref 1.5–4.5)
Glucose: 91 mg/dL (ref 65–99)
Potassium: 4.3 mmol/L (ref 3.5–5.2)
Sodium: 143 mmol/L (ref 134–144)
TOTAL PROTEIN: 7.1 g/dL (ref 6.0–8.5)

## 2014-11-05 LAB — TSH: TSH: 1.05 u[IU]/mL (ref 0.450–4.500)

## 2014-11-07 ENCOUNTER — Telehealth: Payer: Self-pay

## 2014-11-07 DIAGNOSIS — E785 Hyperlipidemia, unspecified: Secondary | ICD-10-CM

## 2014-11-07 NOTE — Telephone Encounter (Signed)
Pt advised as directed below.  She agrees to try a different Statin, please send to [facility].  I let her know you were on vacation this week and she was fine with waiting until next week to get the prescription.    Thanks,   -Mickel Baas

## 2014-11-07 NOTE — Telephone Encounter (Signed)
-----   Message from Margarita Rana, MD sent at 11/05/2014  8:47 AM EDT ----- Labs stable except cholesterol is still elevated at 250. Even with healthy lifestyle, sometimes liver still makes cholesterol. 10 year risk of heart disease is 8.9 percent and medication is recommended. Know that Lipitor  Caused leg cramps, please see if patient wants to try another statin. Thanks.

## 2014-11-09 DIAGNOSIS — E785 Hyperlipidemia, unspecified: Secondary | ICD-10-CM | POA: Insufficient documentation

## 2014-11-09 MED ORDER — PRAVASTATIN SODIUM 20 MG PO TABS: 20.0000 mg | ORAL_TABLET | Freq: Every day | ORAL | Status: DC

## 2014-11-09 NOTE — Telephone Encounter (Signed)
Sent rx. Please make sure patient stops if any side effects and call for labs slip to recheck met c and lipid panel and CK in 6 weeks. Thanks.

## 2014-11-29 ENCOUNTER — Other Ambulatory Visit: Payer: Self-pay | Admitting: Family Medicine

## 2014-11-29 DIAGNOSIS — M1710 Unilateral primary osteoarthritis, unspecified knee: Secondary | ICD-10-CM

## 2014-11-29 DIAGNOSIS — M179 Osteoarthritis of knee, unspecified: Secondary | ICD-10-CM

## 2014-12-01 ENCOUNTER — Telehealth: Payer: Self-pay | Admitting: Family Medicine

## 2014-12-01 NOTE — Telephone Encounter (Signed)
Pt states she stated taking the Rx pravastatin (PRAVACHOL) 20 MG a week ago and she is have muscle cramps in both legs.  Pt states she stopped taking the Rx pravastatin (PRAVACHOL) 20 MG on Sunday and started taking a Rx for meloxicam (MOBIC) 15 MG and the leg cramps have stopped.  Pt also states she is taking 2 fish oil pills a day.  Pt states she can not take the RX Pravastatin.  Pt wanted to let you know.  XY#[phone]/[id number]

## 2014-12-19 ENCOUNTER — Other Ambulatory Visit: Payer: Self-pay | Admitting: Family Medicine

## 2014-12-19 DIAGNOSIS — J309 Allergic rhinitis, unspecified: Secondary | ICD-10-CM

## 2014-12-26 ENCOUNTER — Encounter: Payer: Self-pay | Admitting: Family Medicine

## 2014-12-26 ENCOUNTER — Ambulatory Visit (INDEPENDENT_AMBULATORY_CARE_PROVIDER_SITE_OTHER): Payer: Medicare Other | Admitting: Family Medicine

## 2014-12-26 VITALS — BP 112/74 | HR 56 | Temp 98.4°F | Resp 16 | Wt 163.0 lb

## 2014-12-26 DIAGNOSIS — E785 Hyperlipidemia, unspecified: Secondary | ICD-10-CM | POA: Diagnosis not present

## 2014-12-26 DIAGNOSIS — J0101 Acute recurrent maxillary sinusitis: Secondary | ICD-10-CM

## 2014-12-26 DIAGNOSIS — E669 Obesity, unspecified: Secondary | ICD-10-CM | POA: Diagnosis not present

## 2014-12-26 DIAGNOSIS — J0100 Acute maxillary sinusitis, unspecified: Secondary | ICD-10-CM

## 2014-12-26 DIAGNOSIS — J019 Acute sinusitis, unspecified: Secondary | ICD-10-CM | POA: Insufficient documentation

## 2014-12-26 MED ORDER — AMOXICILLIN-POT CLAVULANATE 875-125 MG PO TABS: 1.0000 | ORAL_TABLET | Freq: Two times a day (BID) | ORAL | Status: DC

## 2014-12-26 NOTE — Progress Notes (Signed)
Patient ID: Sonya Wade, female   DOB: 09/09/1945, 69 y.o.   MRN: [id number]        Patient: Sonya Wade Female    DOB: 1946/04/12   69 y.o.   MRN: [id number] Visit Date: 12/26/2014  Today's Provider: Margarita Rana, MD   Chief Complaint  Patient presents with  . Sinusitis   Subjective:    Sinusitis This is a new problem. The current episode started in the past 7 days. The problem has been gradually worsening since onset. There has been no fever. Her pain is at a severity of 5/10. The pain is mild. Associated symptoms include chills, congestion, diaphoresis, headaches, sinus pressure, sneezing and a sore throat ("Scratchy"). Pertinent negatives include no coughing, ear pain, neck pain, shortness of breath or swollen glands. Past treatments include spray decongestants and oral decongestants. The treatment provided no relief.  Hyperlipidemia This is a chronic problem. The problem is uncontrolled. Recent lipid tests were reviewed and are high. She has no history of chronic renal disease, diabetes, hypothyroidism, liver disease, obesity or nephrotic syndrome. Pertinent negatives include no chest pain or shortness of breath. Current antihyperlipidemic treatment includes statins. There are no compliance problems (Pt reports tolerating Pravastatin well. ).    Lab Results  Component Value Date   CHOL 250* 11/04/2014   HDL 56 11/04/2014   LDLCALC 155* 11/04/2014   TRIG 196* 11/04/2014   CHOLHDL 4.5* 11/04/2014        No Known Allergies Previous Medications   ASPIRIN 81 MG TABLET    Take 81 mg by mouth daily.   BIOFLAVONOID PRODUCTS (BIOFLEX PO)    Take 2 tablets by mouth daily as needed.    CETIRIZINE HCL 10 MG CAPS    Take by mouth.   CHOLECALCIFEROL 1000 UNITS TABLET    Take by mouth.   ESCITALOPRAM (LEXAPRO) 10 MG TABLET    Take by mouth.   FLUTICASONE (FLONASE) 50 MCG/ACT NASAL SPRAY    Two sprays each nostril once a day   LORAZEPAM (ATIVAN) 0.5 MG TABLET     Take by mouth.   MELOXICAM (MOBIC) 15 MG TABLET    TAKE 1 TABLET (15 MG TOTAL) BY MOUTH ONCE DAILY.   MULTIPLE VITAMIN (MULTIVITAMIN) TABLET    Take 1 tablet by mouth daily.   OMEGA-3 FATTY ACIDS (FISH OIL) 1000 MG CAPS    Take by mouth daily.   OMEPRAZOLE (PRILOSEC) 20 MG CAPSULE    Take by mouth.   PRAVASTATIN (PRAVACHOL) 20 MG TABLET    Take 1 tablet (20 mg total) by mouth daily.    Review of Systems  Constitutional: Positive for chills, diaphoresis and fatigue. Negative for fever, activity change, appetite change and unexpected weight change.  HENT: Positive for congestion, postnasal drip, sinus pressure, sneezing, sore throat ("Scratchy") and tinnitus. Negative for dental problem, drooling, ear discharge, ear pain, facial swelling, hearing loss, mouth sores, nosebleeds, rhinorrhea, trouble swallowing and voice change.   Eyes: Positive for discharge and itching. Negative for photophobia, pain, redness and visual disturbance.  Respiratory: Negative for apnea, cough, choking, chest tightness, shortness of breath, wheezing and stridor.   Cardiovascular: Negative for chest pain, palpitations and leg swelling.  Gastrointestinal: Negative for nausea, vomiting, abdominal pain, diarrhea, constipation, blood in stool, abdominal distention, anal bleeding and rectal pain.  Musculoskeletal: Negative for neck pain.  Neurological: Positive for dizziness (Worse when she lays on her left side.) and headaches. Negative for facial asymmetry and light-headedness.  History  Substance Use Topics  . Smoking status: Never Smoker   . Smokeless tobacco: Never Used  . Alcohol Use: No   Objective:   BP 112/74 mmHg  Pulse 56  Temp(Src) 98.4 F (36.9 C) (Oral)  Resp 16  Wt 163 lb (73.936 kg)  Physical Exam  Constitutional: She is oriented to person, place, and time. She appears well-developed and well-nourished.  HENT:  Head: Normocephalic and atraumatic.  Right Ear: Tympanic membrane and external ear  normal.  Left Ear: Tympanic membrane and external ear normal.  Nose: Mucosal edema and rhinorrhea present. Right sinus exhibits maxillary sinus tenderness. Left sinus exhibits maxillary sinus tenderness.  Mouth/Throat: Uvula is midline and oropharynx is clear and moist.  Eyes: Conjunctivae and EOM are normal. Pupils are equal, round, and reactive to light.  Neck: Normal range of motion. Neck supple.  Cardiovascular: Normal rate and regular rhythm.   Pulmonary/Chest: Effort normal and breath sounds normal. She has no wheezes. She has no rales.  Neurological: She is alert and oriented to person, place, and time.        Assessment & Plan:      1. Hyperlipemia Has tolerated medication change. Will check labs and continue medication.  - Lipid panel - Comprehensive metabolic panel  2. Acute maxillary sinusitis, recurrence not specified Condition is worsening. Will start medication for better control.  Patient instructed to call back if condition worsens or does not improve.    - amoxicillin-clavulanate (AUGMENTIN) 875-125 MG per tablet; Take 1 tablet by mouth 2 (two) times daily.  Dispense: 20 tablet; Refill: 0  3. Adiposity Resolved. Continue Weight Watchers.        Margarita Rana, MD  [facility]

## 2015-01-10 DIAGNOSIS — J0101 Acute recurrent maxillary sinusitis: Secondary | ICD-10-CM | POA: Diagnosis not present

## 2015-01-10 DIAGNOSIS — E785 Hyperlipidemia, unspecified: Secondary | ICD-10-CM | POA: Diagnosis not present

## 2015-01-11 ENCOUNTER — Telehealth: Payer: Self-pay | Admitting: Family Medicine

## 2015-01-11 ENCOUNTER — Telehealth: Payer: Self-pay

## 2015-01-11 LAB — COMPREHENSIVE METABOLIC PANEL
A/G RATIO: 1.5 (ref 1.1–2.5)
ALT: 12 IU/L (ref 0–32)
AST: 14 IU/L (ref 0–40)
Albumin: 4 g/dL (ref 3.6–4.8)
Alkaline Phosphatase: 83 IU/L (ref 39–117)
BUN/Creatinine Ratio: 16 (ref 11–26)
BUN: 15 mg/dL (ref 8–27)
Bilirubin Total: 0.4 mg/dL (ref 0.0–1.2)
CALCIUM: 9.2 mg/dL (ref 8.7–10.3)
CO2: 25 mmol/L (ref 18–29)
Chloride: 103 mmol/L (ref 97–108)
Creatinine, Ser: 0.96 mg/dL (ref 0.57–1.00)
GFR calc non Af Amer: 61 mL/min/{1.73_m2} (ref >59)
GFR, EST AFRICAN AMERICAN: 70 mL/min/{1.73_m2} (ref >59)
GLOBULIN, TOTAL: 2.7 g/dL (ref 1.5–4.5)
Glucose: 98 mg/dL (ref 65–99)
POTASSIUM: 4 mmol/L (ref 3.5–5.2)
SODIUM: 144 mmol/L (ref 134–144)
Total Protein: 6.7 g/dL (ref 6.0–8.5)

## 2015-01-11 LAB — LIPID PANEL
Chol/HDL Ratio: 3.1 ratio (ref 0.0–4.4)
Cholesterol, Total: 178 mg/dL (ref 100–199)
HDL: 58 mg/dL
LDL Calculated: 87 mg/dL (ref 0–99)
Triglycerides: 163 mg/dL — ABNORMAL HIGH (ref 0–149)
VLDL Cholesterol Cal: 33 mg/dL (ref 5–40)

## 2015-01-11 NOTE — Telephone Encounter (Signed)
Advised pt as directed below. Pt verbalized fully understanding.  Thanks,

## 2015-01-11 NOTE — Telephone Encounter (Signed)
Pt would like to know if you can accept her sister as a new pt. Name: Sonya Wade. DOB: [birth year](Pt thinks that is correct) Insurance: Pt doesn't know. Pt doesn't know if she has any medical conditions or on medications. Pt stated that her sister does have a PCP but just not happy there. Please advised. Thanks TNP

## 2015-01-11 NOTE — Telephone Encounter (Signed)
Dr. Venia Minks agrees to see pt. Please schedule appointment. Thanks. Renaldo Fiddler, CMA

## 2015-01-11 NOTE — Telephone Encounter (Signed)
-----   Message from Margarita Rana, MD sent at 01/11/2015  6:56 AM EDT ----- Labs stable. Please notify patient. Thanks.

## 2015-01-12 NOTE — Telephone Encounter (Signed)
Pt sister called states pt will call to set up an appt/MW

## 2015-02-07 ENCOUNTER — Other Ambulatory Visit: Payer: Self-pay | Admitting: Family Medicine

## 2015-02-07 DIAGNOSIS — F329 Major depressive disorder, single episode, unspecified: Secondary | ICD-10-CM

## 2015-02-07 DIAGNOSIS — F419 Anxiety disorder, unspecified: Principal | ICD-10-CM

## 2015-02-07 NOTE — Telephone Encounter (Signed)
Ok to call in rx.  Thanks.  

## 2015-02-27 DIAGNOSIS — Z23 Encounter for immunization: Secondary | ICD-10-CM | POA: Diagnosis not present

## 2015-05-04 ENCOUNTER — Encounter: Payer: Self-pay | Admitting: Family Medicine

## 2015-05-04 ENCOUNTER — Other Ambulatory Visit: Payer: Self-pay | Admitting: *Deleted

## 2015-05-04 ENCOUNTER — Ambulatory Visit (INDEPENDENT_AMBULATORY_CARE_PROVIDER_SITE_OTHER): Payer: Medicare Other | Admitting: Family Medicine

## 2015-05-04 VITALS — BP 100/68 | HR 54 | Temp 98.3°F | Resp 16 | Ht 63.0 in | Wt 164.0 lb

## 2015-05-04 DIAGNOSIS — R7309 Other abnormal glucose: Secondary | ICD-10-CM | POA: Diagnosis not present

## 2015-05-04 DIAGNOSIS — Z0000 Encounter for general adult medical examination without abnormal findings: Secondary | ICD-10-CM

## 2015-05-04 DIAGNOSIS — Z1211 Encounter for screening for malignant neoplasm of colon: Secondary | ICD-10-CM | POA: Diagnosis not present

## 2015-05-04 DIAGNOSIS — Z1231 Encounter for screening mammogram for malignant neoplasm of breast: Secondary | ICD-10-CM

## 2015-05-04 DIAGNOSIS — Z780 Asymptomatic menopausal state: Secondary | ICD-10-CM

## 2015-05-04 LAB — IFOBT (OCCULT BLOOD): IFOBT: NEGATIVE

## 2015-05-04 NOTE — Patient Instructions (Addendum)
Take Meloxicam for foot pain. Patient advised to call if symptoms are worsening or not improving for referral to podiatry.

## 2015-05-04 NOTE — Progress Notes (Signed)
Patient ID: Sonya Wade, female   DOB: 08-12-1945, 69 y.o.   MRN: [id number]        Patient: Sonya Wade, Female    DOB: [birth year], 69 y.o.   MRN: [id number] Visit Date: 05/04/2015  Today's Provider: Margarita Rana, MD   Chief Complaint  Patient presents with  . Medicare Wellness   Subjective:    Annual wellness visit Sonya Wade is a 69 y.o. female. She feels well. She reports exercising daily. She reports she is sleeping fairly well.  04/22/14 CPE 11/26/11 Pap-neg 07/01/14 Mammo-BI-RADS 1 05/21/07 Colon-normal Dr. Jamal Collin 12/03/12 BMD-bone thinning, con't vit d recheck in 2 yrs  Lab Results  Component Value Date   WBC 4.0 11/04/2014   HGB 13.0 12/06/2013   HCT 40.7 11/04/2014   PLT 206 12/06/2013   GLUCOSE 98 01/10/2015   CHOL 178 01/10/2015   TRIG 163* 01/10/2015   HDL 58 01/10/2015   LDLCALC 87 01/10/2015   ALT 12 01/10/2015   AST 14 01/10/2015   NA 144 01/10/2015   K 4.0 01/10/2015   CL 103 01/10/2015   CREATININE 0.96 01/10/2015   BUN 15 01/10/2015   CO2 25 01/10/2015   TSH 1.050 11/04/2014   HGBA1C 6.2 08/29/2013    -----------------------------------------------------------   Review of Systems  Constitutional: Negative.   HENT: Positive for sinus pressure.   Eyes: Negative.   Respiratory: Negative.   Cardiovascular: Negative.   Gastrointestinal: Negative.   Endocrine: Negative.   Genitourinary: Negative.   Musculoskeletal: Positive for arthralgias.  Skin: Negative.   Allergic/Immunologic: Negative.   Neurological: Negative.   Hematological: Negative.   Psychiatric/Behavioral: Negative.     Social History   Social History  . Marital Status: Married    Spouse Name: N/A  . Number of Children: N/A  . Years of Education: N/A   Occupational History  . Not on file.   Social History Main Topics  . Smoking status: Never Smoker   . Smokeless tobacco: Never Used  . Alcohol Use: No  . Drug Use: No  .  Sexual Activity: Not on file   Other Topics Concern  . Not on file   Social History Narrative    Patient Active Problem List   Diagnosis Date Noted  . Sinusitis, acute 12/26/2014  . Hyperlipemia 11/09/2014  . Knee pain, bilateral 11/04/2014  . Anxiety and depression 09/19/2014  . Arthritis of knee, degenerative 09/19/2014  . Esophageal reflux 09/19/2014  . Arthritis, degenerative 11/16/2013  . Family history of breast cancer 07/07/2013  . Fibrocystic breast disease 07/07/2013  . Bloodgood disease 07/07/2013  . Decreased leukocytes 09/15/2009  . Allergic rhinitis 09/06/2009    Past Surgical History  Procedure Laterality Date  . Tubal ligation  1980  . Colonoscopy  May 2008    Dr. Jamal Collin  . Breast biopsy Left 2013    core, apocine cyst  . Breast biopsy Left 2005    intraductal papilloma    Her family history includes Cancer in her mother; Diabetes in her father; Hypertension in her sister.    Previous Medications   ASPIRIN 81 MG TABLET    Take 81 mg by mouth daily.   BIOFLAVONOID PRODUCTS (BIOFLEX PO)    Take 2 tablets by mouth daily as needed.    CHOLECALCIFEROL 1000 UNITS TABLET    Take 1,000 Units by mouth daily.    ESCITALOPRAM (LEXAPRO) 10 MG TABLET    Take 10 mg by mouth daily.  FLUTICASONE (FLONASE) 50 MCG/ACT NASAL SPRAY    Two sprays each nostril once a day   LORAZEPAM (ATIVAN) 0.5 MG TABLET    TAKE 1/2 TO 1 TABLET BY MOUTH TWICE A DAY AS NEEDED   MELOXICAM (MOBIC) 15 MG TABLET    TAKE 1 TABLET (15 MG TOTAL) BY MOUTH ONCE DAILY.   MISC NATURAL PRODUCTS (OSTEO BI-FLEX/5-LOXIN ADVANCED) TABS    Take 2 tablets by mouth daily.    MULTIPLE VITAMIN (MULTIVITAMIN) TABLET    Take 1 tablet by mouth daily.   OMEGA-3 FATTY ACIDS (FISH OIL) 1000 MG CAPS    Take by mouth daily.   OMEPRAZOLE (PRILOSEC) 20 MG CAPSULE    Take by mouth.   PRAVASTATIN (PRAVACHOL) 20 MG TABLET    Take 1 tablet (20 mg total) by mouth daily.    Patient Care Team: Margarita Rana, MD as PCP -  General (Family Medicine) Seeplaputhur Robinette Haines, MD (General Surgery) Wardell Honour, MD as Consulting Physician (Family Medicine)     Objective:   Vitals: BP 100/68 mmHg  Pulse 54  Temp(Src) 98.3 F (36.8 C) (Oral)  Resp 16  Ht 5\' 3"  (1.6 m)  Wt 164 lb (74.39 kg)  BMI 29.06 kg/m2  SpO2 99%  Physical Exam  Constitutional: She is oriented to person, place, and time. She appears well-developed and well-nourished.  HENT:  Head: Normocephalic and atraumatic.  Right Ear: Tympanic membrane, external ear and ear canal normal.  Left Ear: Tympanic membrane, external ear and ear canal normal.  Nose: Nose normal.  Mouth/Throat: Uvula is midline, oropharynx is clear and moist and mucous membranes are normal.  Eyes: Conjunctivae, EOM and lids are normal. Pupils are equal, round, and reactive to light.  Neck: Trachea normal and normal range of motion. Neck supple. Carotid bruit is not present. No thyroid mass and no thyromegaly present.  Cardiovascular: Normal rate, regular rhythm and normal heart sounds.   Pulmonary/Chest: Effort normal and breath sounds normal. Left breast exhibits inverted nipple.  Abdominal: Soft. Normal appearance and bowel sounds are normal. There is no hepatosplenomegaly. There is no tenderness.  Genitourinary: No breast swelling, tenderness or discharge.  Musculoskeletal: Normal range of motion.  Lymphadenopathy:    She has no cervical adenopathy.    She has no axillary adenopathy.  Neurological: She is alert and oriented to person, place, and time. She has normal strength. No cranial nerve deficit.  Skin: Skin is warm, dry and intact.  Psychiatric: She has a normal mood and affect. Her speech is normal and behavior is normal. Judgment and thought content normal. Cognition and memory are normal.    Activities of Daily Living In your present state of health, do you have any difficulty performing the following activities: 05/04/2015 11/04/2014  Hearing? N N  Vision?  N N  Difficulty concentrating or making decisions? N N  Walking or climbing stairs? N Y  Dressing or bathing? N N  Doing errands, shopping? N N    Fall Risk Assessment Fall Risk  05/04/2015 11/04/2014  Falls in the past year? Yes Yes  Number falls in past yr: 1 1  Injury with Fall? Yes Yes  Follow up - Falls prevention discussed     Depression Screen PHQ 2/9 Scores 05/04/2015 11/04/2014  PHQ - 2 Score 0 0    Cognitive Testing - 6-CIT  Correct? Score   What year is it? yes 0 0 or 4  What month is it? yes 0 0 or 3  Memorize:  Pia Mau,  60,  [place],  [place],      What time is it? (within 1 hour) yes 0 0 or 3  Count backwards from 20 yes 0 0, 2, or 4  Name the months of the year yes 0 0, 2, or 4  Repeat name & address above yes 0 0, 2, 4, 6, 8, or 10       TOTAL SCORE  1/28   Interpretation:  Normal  Normal (0-7) Abnormal (8-28)       Assessment & Plan:     Annual Wellness Visit  Reviewed patient's Family Medical History Reviewed and updated list of patient's medical providers Assessment of cognitive impairment was done Assessed patient's functional ability Established a written schedule for health screening services Health Risk Assessment Completed and Reviewed  Exercise Activities and Dietary recommendations Goals    . Exercise 150 minutes per week (moderate activity)       Immunization History  Administered Date(s) Administered  . Pneumococcal Conjugate-13 04/22/2014  . Pneumococcal Polysaccharide-23 11/26/2011  . Tdap 10/31/2010  . Zoster 10/14/2014      1. Medicare annual wellness visit, subsequent Stable. Patient advised to continue eating healthy and exercise daily.  2. Abnormal blood sugar F/U pending lab report. - Hemoglobin A1c  3. Colon cancer screening - IFOBT POC (occult bld, rslt in office)  4. Postmenopausal - DG Bone Density; Future     Patient seen and examined by Dr. Jerrell Belfast, and note scribed by Philbert Riser. Dimas, CMA. I have reviewed the document for accuracy and completeness and I agree with above. Jerrell Belfast, MD   Margarita Rana, MD   ------------------------------------------------------------------------------------------------------------

## 2015-05-05 LAB — HEMOGLOBIN A1C
ESTIMATED AVERAGE GLUCOSE: 134 mg/dL
HEMOGLOBIN A1C: 6.3 % — AB (ref 4.8–5.6)

## 2015-07-03 ENCOUNTER — Ambulatory Visit
Admission: RE | Admit: 2015-07-03 | Discharge: 2015-07-03 | Disposition: A | Discharge status: Home / Self Care | Payer: Medicare Other | Source: Ambulatory Visit | Attending: General Surgery | Admitting: General Surgery

## 2015-07-03 ENCOUNTER — Telehealth: Payer: Self-pay

## 2015-07-03 ENCOUNTER — Ambulatory Visit
Admission: RE | Admit: 2015-07-03 | Discharge: 2015-07-03 | Disposition: A | Discharge status: Home / Self Care | Payer: Medicare Other | Source: Ambulatory Visit | Attending: Family Medicine | Admitting: Family Medicine

## 2015-07-03 ENCOUNTER — Other Ambulatory Visit: Payer: Self-pay | Admitting: General Surgery

## 2015-07-03 DIAGNOSIS — M85852 Other specified disorders of bone density and structure, left thigh: Secondary | ICD-10-CM | POA: Diagnosis not present

## 2015-07-03 DIAGNOSIS — Z780 Asymptomatic menopausal state: Secondary | ICD-10-CM | POA: Insufficient documentation

## 2015-07-03 DIAGNOSIS — Z13820 Encounter for screening for osteoporosis: Secondary | ICD-10-CM | POA: Insufficient documentation

## 2015-07-03 DIAGNOSIS — M8580 Other specified disorders of bone density and structure, unspecified site: Secondary | ICD-10-CM | POA: Diagnosis not present

## 2015-07-03 DIAGNOSIS — Z1231 Encounter for screening mammogram for malignant neoplasm of breast: Secondary | ICD-10-CM

## 2015-07-03 NOTE — Telephone Encounter (Signed)
-----   Message from Margarita Rana, MD sent at 07/03/2015  3:10 PM EST ----- Bone density with mild bone thinning, not osteoporosis.  Repeat in 2 to 3 years. Thanks.

## 2015-07-03 NOTE — Telephone Encounter (Signed)
Pt advised.   Thanks,   -Laura  

## 2015-07-13 ENCOUNTER — Ambulatory Visit (INDEPENDENT_AMBULATORY_CARE_PROVIDER_SITE_OTHER): Payer: Medicare Other | Admitting: General Surgery

## 2015-07-13 ENCOUNTER — Encounter: Payer: Self-pay | Admitting: General Surgery

## 2015-07-13 DIAGNOSIS — Z803 Family history of malignant neoplasm of breast: Secondary | ICD-10-CM | POA: Diagnosis not present

## 2015-07-13 DIAGNOSIS — Z86018 Personal history of other benign neoplasm: Secondary | ICD-10-CM

## 2015-07-13 DIAGNOSIS — Z87898 Personal history of other specified conditions: Secondary | ICD-10-CM | POA: Diagnosis not present

## 2015-07-13 NOTE — Patient Instructions (Addendum)
The patient is aware to call back for any questions or concerns.  The patient has been asked to return to the office in one year with a bilateral screening mammogram 

## 2015-07-13 NOTE — Progress Notes (Signed)
Patient ID: Sonya Wade, female   DOB: Oct 06, 1945, 70 y.o.   MRN: [id number]  Chief Complaint  Patient presents with  . Follow-up    Mammogram    HPI Sonya Wade is a 70 y.o. female who presents for a breast evaluation. The most recent mammogram was done on 07/03/15.  Patient does perform regular self breast checks and gets regular mammograms done. No new breast changes. Patient has lost over 30 I have reviewed the history of present illness with the patient.  HPI  Past Medical History  Diagnosis Date  . Arthritis   . Benign neoplasm of breast 2011  . Diffuse cystic mastopathy   . Lump or mass in breast   . Special screening for malignant neoplasms, colon   . Obesity, unspecified   . Family history of malignant neoplasm of breast   . Hyperlipidemia   . Sinus problem     Past Surgical History  Procedure Laterality Date  . Tubal ligation  1980  . Colonoscopy  May 2008    Dr. Jamal Collin  . Breast biopsy Left 2013    core, apocine cyst  . Breast biopsy Left 2005    intraductal papilloma    Family History  Problem Relation Age of Onset  . Breast cancer Mother 22  . Diabetes Father   . Hypertension Sister     Colon Polyps    Social History Social History  Substance Use Topics  . Smoking status: Never Smoker   . Smokeless tobacco: Never Used  . Alcohol Use: No    No Known Allergies  Current Outpatient Prescriptions  Medication Sig Dispense Refill  . aspirin 81 MG tablet Take 81 mg by mouth daily.    Marland Kitchen Bioflavonoid Products (BIOFLEX PO) Take 2 tablets by mouth daily as needed.     . Cholecalciferol 1000 UNITS tablet Take 1,000 Units by mouth daily.     . fluticasone (FLONASE) 50 MCG/ACT nasal spray Two sprays each nostril once a day 48 g 3  . LORazepam (ATIVAN) 0.5 MG tablet TAKE 1/2 TO 1 TABLET BY MOUTH TWICE A DAY AS NEEDED 30 tablet 5  . meloxicam (MOBIC) 15 MG tablet TAKE 1 TABLET (15 MG TOTAL) BY MOUTH ONCE DAILY. 90 tablet 3  . Misc  Natural Products (OSTEO BI-FLEX/5-LOXIN ADVANCED) TABS Take 2 tablets by mouth daily.     . Multiple Vitamin (MULTIVITAMIN) tablet Take 1 tablet by mouth daily.    . Omega-3 Fatty Acids (FISH OIL) 1000 MG CAPS Take by mouth daily.    Marland Kitchen omeprazole (PRILOSEC) 20 MG capsule Take by mouth.    . pravastatin (PRAVACHOL) 20 MG tablet Take 1 tablet (20 mg total) by mouth daily. 90 tablet 3   No current facility-administered medications for this visit.    Review of Systems Review of Systems  Constitutional: Negative.   Respiratory: Negative.   Cardiovascular: Negative.     Blood pressure 130/74, pulse 76, resp. rate 14, height 5\' 4"  (1.626 m), weight 164 lb (74.39 kg).  Physical Exam Physical Exam  Constitutional: She is oriented to person, place, and time. She appears well-developed and well-nourished.  Eyes: Conjunctivae are normal. No scleral icterus.  Neck: Neck supple.  Cardiovascular: Normal rate, regular rhythm and normal heart sounds.   Pulmonary/Chest: Effort normal and breath sounds normal. Right breast exhibits no inverted nipple, no mass, no nipple discharge, no skin change and no tenderness. Left breast exhibits no inverted nipple, no mass, no nipple discharge,  no skin change and no tenderness.  Left breast-scar from previous biopsy    Abdominal: Soft. Bowel sounds are normal. There is no hepatomegaly. There is no tenderness. No hernia.  Lymphadenopathy:    She has no cervical adenopathy.    She has no axillary adenopathy.  Neurological: She is alert and oriented to person, place, and time.  Skin: Skin is warm and dry.    Data Reviewed Mammogram reviewd-stable  Assessment    Stable exam. History of FCD. FH of breast cancer     Plan    The patient has been asked to return to the office in one year with a bilateral screening mammogram.     This information has been scribed by Verlene Mayer, CMA   PCP: Dr. Phineas Semen 07/15/2015, 9:08  AM

## 2015-07-15 ENCOUNTER — Encounter: Payer: Self-pay | Admitting: General Surgery

## 2015-08-30 ENCOUNTER — Other Ambulatory Visit: Payer: Self-pay | Admitting: Family Medicine

## 2015-08-30 DIAGNOSIS — F419 Anxiety disorder, unspecified: Principal | ICD-10-CM

## 2015-08-30 DIAGNOSIS — F329 Major depressive disorder, single episode, unspecified: Secondary | ICD-10-CM

## 2015-08-30 NOTE — Telephone Encounter (Signed)
AWV in December, 2016. Sonya Wade, CMA

## 2015-10-17 ENCOUNTER — Other Ambulatory Visit: Payer: Self-pay | Admitting: Family Medicine

## 2015-10-17 DIAGNOSIS — K219 Gastro-esophageal reflux disease without esophagitis: Secondary | ICD-10-CM

## 2015-11-24 ENCOUNTER — Other Ambulatory Visit: Payer: Self-pay

## 2015-11-24 DIAGNOSIS — E785 Hyperlipidemia, unspecified: Secondary | ICD-10-CM

## 2015-11-24 MED ORDER — PRAVASTATIN SODIUM 20 MG PO TABS: 20.0000 mg | ORAL_TABLET | Freq: Every day | ORAL | Status: DC

## 2016-02-01 ENCOUNTER — Other Ambulatory Visit: Payer: Self-pay | Admitting: Family Medicine

## 2016-02-01 DIAGNOSIS — M179 Osteoarthritis of knee, unspecified: Secondary | ICD-10-CM

## 2016-02-01 DIAGNOSIS — M1710 Unilateral primary osteoarthritis, unspecified knee: Secondary | ICD-10-CM

## 2016-02-22 DIAGNOSIS — Z23 Encounter for immunization: Secondary | ICD-10-CM | POA: Diagnosis not present

## 2016-02-23 ENCOUNTER — Other Ambulatory Visit: Payer: Self-pay | Admitting: Family Medicine

## 2016-02-23 DIAGNOSIS — J309 Allergic rhinitis, unspecified: Secondary | ICD-10-CM

## 2016-02-23 NOTE — Telephone Encounter (Signed)
Please review-aa 

## 2016-03-01 ENCOUNTER — Other Ambulatory Visit: Payer: Self-pay | Admitting: Physician Assistant

## 2016-03-01 DIAGNOSIS — E785 Hyperlipidemia, unspecified: Secondary | ICD-10-CM

## 2016-03-04 ENCOUNTER — Other Ambulatory Visit: Payer: Self-pay | Admitting: Family Medicine

## 2016-03-04 DIAGNOSIS — F329 Major depressive disorder, single episode, unspecified: Secondary | ICD-10-CM

## 2016-03-04 DIAGNOSIS — F419 Anxiety disorder, unspecified: Principal | ICD-10-CM

## 2016-03-04 NOTE — Telephone Encounter (Signed)
[facility] 

## 2016-03-05 DIAGNOSIS — M545 Low back pain: Secondary | ICD-10-CM | POA: Diagnosis not present

## 2016-04-29 ENCOUNTER — Other Ambulatory Visit: Payer: Self-pay

## 2016-04-29 DIAGNOSIS — Z1231 Encounter for screening mammogram for malignant neoplasm of breast: Secondary | ICD-10-CM

## 2016-05-04 ENCOUNTER — Other Ambulatory Visit: Payer: Self-pay | Admitting: Family Medicine

## 2016-05-04 DIAGNOSIS — J309 Allergic rhinitis, unspecified: Secondary | ICD-10-CM

## 2016-06-03 ENCOUNTER — Encounter: Payer: Self-pay | Admitting: Family Medicine

## 2016-06-03 ENCOUNTER — Ambulatory Visit (INDEPENDENT_AMBULATORY_CARE_PROVIDER_SITE_OTHER): Payer: Medicare Other | Admitting: Family Medicine

## 2016-06-03 VITALS — BP 124/64 | HR 51 | Temp 98.1°F | Resp 16 | Ht 64.0 in | Wt 161.0 lb

## 2016-06-03 DIAGNOSIS — R7303 Prediabetes: Secondary | ICD-10-CM | POA: Diagnosis not present

## 2016-06-03 DIAGNOSIS — Z803 Family history of malignant neoplasm of breast: Secondary | ICD-10-CM

## 2016-06-03 DIAGNOSIS — E785 Hyperlipidemia, unspecified: Secondary | ICD-10-CM | POA: Diagnosis not present

## 2016-06-03 DIAGNOSIS — F419 Anxiety disorder, unspecified: Secondary | ICD-10-CM

## 2016-06-03 DIAGNOSIS — N390 Urinary tract infection, site not specified: Secondary | ICD-10-CM | POA: Diagnosis not present

## 2016-06-03 DIAGNOSIS — Z0000 Encounter for general adult medical examination without abnormal findings: Secondary | ICD-10-CM | POA: Diagnosis not present

## 2016-06-03 DIAGNOSIS — F329 Major depressive disorder, single episode, unspecified: Secondary | ICD-10-CM

## 2016-06-03 DIAGNOSIS — R319 Hematuria, unspecified: Secondary | ICD-10-CM

## 2016-06-03 DIAGNOSIS — K219 Gastro-esophageal reflux disease without esophagitis: Secondary | ICD-10-CM

## 2016-06-03 DIAGNOSIS — N6019 Diffuse cystic mastopathy of unspecified breast: Secondary | ICD-10-CM

## 2016-06-03 DIAGNOSIS — R350 Frequency of micturition: Secondary | ICD-10-CM | POA: Diagnosis not present

## 2016-06-03 DIAGNOSIS — F418 Other specified anxiety disorders: Secondary | ICD-10-CM | POA: Diagnosis not present

## 2016-06-03 LAB — POCT URINALYSIS DIPSTICK
BILIRUBIN UA: NEGATIVE
GLUCOSE UA: NEGATIVE
Ketones, UA: NEGATIVE
LEUKOCYTES UA: NEGATIVE
NITRITE UA: NEGATIVE
Protein, UA: NEGATIVE
Spec Grav, UA: >=1.03
UROBILINOGEN UA: 0.2
pH, UA: 6

## 2016-06-03 MED ORDER — CIPROFLOXACIN HCL 500 MG PO TABS: 500.0000 mg | ORAL_TABLET | Freq: Two times a day (BID) | ORAL | 0 refills | Status: AC

## 2016-06-03 NOTE — Progress Notes (Signed)
Patient: Sonya Wade, Female    DOB: February 12, 1946, 71 y.o.   MRN: [id number] Visit Date: 06/03/2016  Today's Provider: Lelon Huh, MD   Chief Complaint  Patient presents with  . Annual Exam  . Hyperlipidemia  . Anxiety  . Depression   Subjective:   This is a previous patient of Dr. Venia Minks present today as new patient to me to establish care and follow up on chronic medical problems.    Annual physical Sonya Wade is a 71 y.o. female. She feels fairly well. She reports exercising yes. She reports she is sleeping poorly.  -----------------------------------------------------------    Anxiety and depression From 11/04/2014-no change.    Lipid/Cholesterol, Follow-up:   Last seen for this 11/04/2014. Management since that visit includes; no changes. She had been prescribed pravastatin but states she stopped taking it about 2 months ago because she thinks it made her knees hurt more.   Last Lipid Panel:    Component Value Date/Time   CHOL 178 01/10/2015 1104   CHOL 234 (H) 08/30/2013 0346   TRIG 163 (H) 01/10/2015 1104   TRIG 313 (H) 08/30/2013 0346   HDL 58 01/10/2015 1104   HDL 40 08/30/2013 0346   CHOLHDL 3.1 01/10/2015 1104   VLDL 63 (H) 08/30/2013 0346   LDLCALC 87 01/10/2015 1104   LDLCALC 131 (H) 08/30/2013 0346    She reports good compliance with treatment. She is not having side effects. none  Wt Readings from Last 3 Encounters:  06/03/16 161 lb (73 kg)  07/13/15 164 lb (74.4 kg)  05/04/15 164 lb (74.4 kg)    ----------------------------------------------------------------  She also complains of urinary frequency urgency and burning with urination for the last 5-6 days. No fevers or chills.    Review of Systems  Constitutional: Negative.   HENT: Positive for sinus pressure.   Eyes: Negative.   Respiratory: Negative.   Cardiovascular: Negative.   Gastrointestinal: Positive for abdominal pain.  Endocrine: Positive  for polyuria.  Genitourinary: Positive for frequency.  Musculoskeletal: Positive for arthralgias and back pain.  Skin: Negative.   Allergic/Immunologic: Negative.   Neurological: Negative.   Hematological: Negative.   Psychiatric/Behavioral: Negative.     Social History   Social History  . Marital status: Married    Spouse name: N/A  . Number of children: N/A  . Years of education: N/A   Occupational History  . Not on file.   Social History Main Topics  . Smoking status: Never Smoker  . Smokeless tobacco: Never Used  . Alcohol use No  . Drug use: No  . Sexual activity: Not on file   Other Topics Concern  . Not on file   Social History Narrative  . No narrative on file    Past Medical History:  Diagnosis Date  . Arthritis   . Benign neoplasm of breast 2011  . Diffuse cystic mastopathy   . Family history of malignant neoplasm of breast   . Hyperlipidemia   . Lump or mass in breast   . Obesity, unspecified   . Sinus problem   . Special screening for malignant neoplasms, colon      Patient Active Problem List   Diagnosis Date Noted  . Sinusitis, acute 12/26/2014  . Hyperlipemia 11/09/2014  . Knee pain, bilateral 11/04/2014  . Anxiety and depression 09/19/2014  . Arthritis of knee, degenerative 09/19/2014  . Esophageal reflux 09/19/2014  . Arthritis, degenerative 11/16/2013  . Family history of  breast cancer 07/07/2013  . Fibrocystic breast disease 07/07/2013  . Bloodgood disease 07/07/2013  . Decreased leukocytes 09/15/2009  . Allergic rhinitis 09/06/2009    Past Surgical History:  Procedure Laterality Date  . BREAST BIOPSY Left 2013   core, apocine cyst  . BREAST BIOPSY Left 2005   intraductal papilloma  . COLONOSCOPY  May 2008   Dr. Jamal Collin  . TUBAL LIGATION  1980    Her family history includes Breast cancer (age of onset: 62) in her mother; Diabetes in her father; Hypertension in her sister.      Current Outpatient Prescriptions:  .   aspirin 81 MG tablet, Take 81 mg by mouth daily., Disp: , Rfl:  .  Cholecalciferol 1000 UNITS tablet, Take 1,000 Units by mouth daily. , Disp: , Rfl:  .  escitalopram (LEXAPRO) 10 MG tablet, TAKE 1 TABLET BY MOUTH DAILY, Disp: 90 tablet, Rfl: 3 .  fluticasone (FLONASE) 50 MCG/ACT nasal spray, INSTILL TWO SPRAYS INTO EACH NOSTRIL ONCE A DAY, Disp: 48 g, Rfl: 1 .  LORazepam (ATIVAN) 0.5 MG tablet, TAKE 1/2 TO 1 TABLET BY MOUTH TWICE A DAY AS NEEDED, Disp: 30 tablet, Rfl: 5 .  meloxicam (MOBIC) 15 MG tablet, TAKE 1 TABLET (15 MG TOTAL) BY MOUTH ONCE DAILY., Disp: 90 tablet, Rfl: 1 .  Misc Natural Products (OSTEO BI-FLEX/5-LOXIN ADVANCED) TABS, Take 2 tablets by mouth daily. , Disp: , Rfl:  .  Multiple Vitamin (MULTIVITAMIN) tablet, Take 1 tablet by mouth daily., Disp: , Rfl:  .  Omega-3 Fatty Acids (FISH OIL) 1000 MG CAPS, Take by mouth daily., Disp: , Rfl:  .  omeprazole (PRILOSEC) 20 MG capsule, TAKE ONE CAPSULE BY MOUTH DAILY, Disp: 90 capsule, Rfl: 1 .  pravastatin (PRAVACHOL) 20 MG tablet, TAKE 1 TABLET (20 MG TOTAL) BY MOUTH DAILY., Disp: 90 tablet, Rfl: 1  Patient Care Team: Birdie Sons, MD as PCP - General (Family Medicine) Seeplaputhur Robinette Haines, MD (General Surgery) Wardell Honour, MD as Consulting Physician (Family Medicine)     Objective:   Vitals: BP 124/64 (BP Location: Right Arm, Patient Position: Sitting, Cuff Size: Normal)   Pulse (!) 51   Temp 98.1 F (36.7 C) (Oral)   Resp 16   Ht 5\' 4"  (1.626 m)   Wt 161 lb (73 kg)   SpO2 99%   BMI 27.64 kg/m   Physical Exam   General Appearance:    Alert, cooperative, no distress, appears stated age  Head:    Normocephalic, without obvious abnormality, atraumatic  Eyes:    PERRL, conjunctiva/corneas clear, EOM's intact, fundi    benign, both eyes  Ears:    Normal TM's and external ear canals, both ears  Nose:   Nares normal, septum midline, mucosa normal, no drainage    or sinus tenderness  Throat:   Lips, mucosa, and  tongue normal; teeth and gums normal  Neck:   Supple, symmetrical, trachea midline, no adenopathy;    thyroid:  no enlargement/tenderness/nodules; no carotid   bruit or JVD  Back:     Symmetric, no curvature, ROM normal, no CVA tenderness  Lungs:     Clear to auscultation bilaterally, respirations unlabored  Chest Wall:    No tenderness or deformity   Heart:    Regular rate and rhythm, S1 and S2 normal, no murmur, rub   or gallop  Breast Exam:    deferred  Abdomen:     Soft, non-tender, bowel sounds active all four quadrants,  no masses, no organomegaly  Pelvic:    deferred  Extremities:   Extremities normal, atraumatic, no cyanosis or edema  Pulses:   2+ and symmetric all extremities  Skin:   Skin color, texture, turgor normal, no rashes or lesions  Lymph nodes:   Cervical, supraclavicular, and axillary nodes normal  Neurologic:   CNII-XII intact, normal strength, sensation and reflexes    throughout     Activities of Daily Living In your present state of health, do you have any difficulty performing the following activities: 06/03/2016  Hearing? N  Vision? Y  Difficulty concentrating or making decisions? N  Walking or climbing stairs? N  Dressing or bathing? N  Doing errands, shopping? N  Some recent data might be hidden    Fall Risk Assessment Fall Risk  06/03/2016 05/04/2015 11/04/2014  Falls in the past year? No Yes Yes  Number falls in past yr: - 1 1  Injury with Fall? - Yes Yes  Follow up - - Falls prevention discussed     Depression Screen PHQ 2/9 Scores 06/03/2016 05/04/2015 11/04/2014  PHQ - 2 Score 0 0 0    Cognitive Testing - 6-CIT  Correct? Score   What year is it? yes 0 0 or 4  What month is it? yes 0 0 or 3  Memorize:    Sonya Wade, Sonya Wade,  43,  [address],  [place],      What time is it? (within 1 hour) yes 0 0 or 3  Count backwards from 20 yes 0 0, 2, or 4  Name the months of the year yes 0 0, 2, or 4  Repeat name & address above yes 0 0, 2, 4, 6, 8, or  10       TOTAL SCORE  0/28   Interpretation:  Normal  Normal (0-7) Abnormal (8-28)    Audit-C Alcohol Use Screening  Question Answer Points  How often do you have alcoholic drink? never 0  On days you do drink alcohol, how many drinks do you typically consume? n/a 0  How oftey will you drink 6 or more in a total? never 0  Total Score:  0   A score of 3 or more in women, and 4 or more in men indicates increased risk for alcohol abuse, EXCEPT if all of the points are from question 1.  Results for orders placed or performed in visit on 06/03/16  POCT urinalysis dipstick  Result Value Ref Range   Color, UA Dark Yellow    Clarity, UA Cloudy    Glucose, UA Neg    Bilirubin, UA Neg    Ketones, UA Neg    Spec Grav, UA >=1.030    Blood, UA Large    pH, UA 6.0    Protein, UA Neg    Urobilinogen, UA 0.2    Nitrite, UA Neg    Leukocytes, UA Negative Negative       Assessment & Plan:     Annual Physical  Reviewed patient's Family Medical History Reviewed and updated list of patient's medical providers Assessment of cognitive impairment was done Assessed patient's functional ability Established a written schedule for health screening services Health Risk Assessent Completed and Reviewed  Exercise Activities and Dietary recommendations Goals    . Exercise 150 minutes per week (moderate activity)       Immunization History  Administered Date(s) Administered  . Influenza-Unspecified 02/27/2015, 02/22/2016  . Pneumococcal Conjugate-13 04/22/2014  . Pneumococcal Polysaccharide-23 11/26/2011  . Tdap  10/31/2010  . Zoster 10/14/2014    Health Maintenance  Topic Date Due  . INFLUENZA VACCINE  12/19/2015  . COLONOSCOPY  05/20/2017  . MAMMOGRAM  07/02/2017  . TETANUS/TDAP  10/30/2020  . DEXA SCAN  Completed  . ZOSTAVAX  Completed  . Hepatitis C Screening  Completed  . PNA vac Low Risk Adult  Completed     Discussed health benefits of physical activity, and encouraged  her to engage in regular exercise appropriate for her age and condition.    --------------------------------------------------------------------------  1. Annual physical exam Generally doing well. Breast exam deferred to Dr. Jamal Collin.   2. Frequency of urination  - POCT urinalysis dipstick  3. Gastroesophageal reflux disease without esophagitis Well controlled on prn omeprazole.   4. Anxiety and depression Doing well on escitalopram which will be continued unchanged.   5. Hyperlipidemia, unspecified hyperlipidemia type She did not like taking pravastatin due to knee pain. No other cardiac risk factors. Reassess cardiac risk after getting labs back.  - Comprehensive metabolic panel - Lipid panel - TSH  6. Hematuria, unspecified type   7. Urinary tract infection without hematuria, site unspecified  - ciprofloxacin (CIPRO) 500 MG tablet; Take 1 tablet (500 mg total) by mouth 2 (two) times daily.  Dispense: 14 tablet; Refill: 0 - CULTURE, URINE COMPREHENSIVE  8. Pre-diabetes  - Hemoglobin A1c  9. Fibrocystic breast disease (FCBD), unspecified laterality Per Dr. Beverely Low, MD  [facility]

## 2016-06-04 DIAGNOSIS — E785 Hyperlipidemia, unspecified: Secondary | ICD-10-CM | POA: Diagnosis not present

## 2016-06-04 DIAGNOSIS — R7303 Prediabetes: Secondary | ICD-10-CM | POA: Diagnosis not present

## 2016-06-05 LAB — COMPREHENSIVE METABOLIC PANEL
ALBUMIN: 4.2 g/dL (ref 3.6–4.8)
ALK PHOS: 73 IU/L (ref 39–117)
ALT: 13 IU/L (ref 0–32)
AST: 14 IU/L (ref 0–40)
Albumin/Globulin Ratio: 1.7 (ref 1.2–2.2)
BILIRUBIN TOTAL: 0.3 mg/dL (ref 0.0–1.2)
BUN / CREAT RATIO: 23 (ref 12–28)
BUN: 19 mg/dL (ref 8–27)
CHLORIDE: 103 mmol/L (ref 96–106)
CO2: 29 mmol/L (ref 18–29)
CREATININE: 0.84 mg/dL (ref 0.57–1.00)
Calcium: 9 mg/dL (ref 8.7–10.3)
GFR calc Af Amer: 82 mL/min/{1.73_m2} (ref >59)
GFR calc non Af Amer: 71 mL/min/{1.73_m2} (ref >59)
GLUCOSE: 91 mg/dL (ref 65–99)
Globulin, Total: 2.5 g/dL (ref 1.5–4.5)
Potassium: 4.2 mmol/L (ref 3.5–5.2)
Sodium: 144 mmol/L (ref 134–144)
Total Protein: 6.7 g/dL (ref 6.0–8.5)

## 2016-06-05 LAB — TSH: TSH: 0.841 u[IU]/mL (ref 0.450–4.500)

## 2016-06-05 LAB — HEMOGLOBIN A1C
ESTIMATED AVERAGE GLUCOSE: 126 mg/dL
Hgb A1c MFr Bld: 6 % — ABNORMAL HIGH (ref 4.8–5.6)

## 2016-06-05 LAB — LIPID PANEL
CHOLESTEROL TOTAL: 248 mg/dL — AB (ref 100–199)
Chol/HDL Ratio: 3.9 ratio units (ref 0.0–4.4)
HDL: 64 mg/dL (ref >39)
LDL Calculated: 165 mg/dL — ABNORMAL HIGH (ref 0–99)
TRIGLYCERIDES: 96 mg/dL (ref 0–149)
VLDL CHOLESTEROL CAL: 19 mg/dL (ref 5–40)

## 2016-06-07 ENCOUNTER — Telehealth: Payer: Self-pay

## 2016-06-07 NOTE — Telephone Encounter (Signed)
Unable to leave a message due to voicemail not set up. Will try again later.

## 2016-06-07 NOTE — Telephone Encounter (Signed)
-----   Message from Birdie Sons, MD sent at 06/05/2016  8:06 PM EST ----- Cholesterol is much too high at 248. Blood sugar is borderline for diabetes. Avoid sweets in diet. Recommend trial of atorvastatin 10mg  once daily, #30, rf x 3 for cholesterol and follow up for cholesterol in 3 months.

## 2016-06-07 NOTE — Telephone Encounter (Signed)
-----   Message from Birdie Sons, MD sent at 06/07/2016  1:05 PM EST ----- Urine culture is negative. Go ahead and finish the antibiotic. Need to come back to recheck urinalysis next week to make sure blood has cleared

## 2016-06-08 LAB — CULTURE, URINE COMPREHENSIVE

## 2016-06-10 NOTE — Telephone Encounter (Signed)
Patient was notified of results. Expressed understanding. Patient stated that she still has some pravastatin that she never finished. Patient would like to take this instead.

## 2016-06-11 ENCOUNTER — Encounter: Payer: Self-pay | Admitting: Family Medicine

## 2016-06-11 ENCOUNTER — Ambulatory Visit (INDEPENDENT_AMBULATORY_CARE_PROVIDER_SITE_OTHER): Payer: Medicare Other | Admitting: Family Medicine

## 2016-06-11 VITALS — BP 112/66 | HR 53 | Temp 98.0°F | Resp 16 | Wt 169.0 lb

## 2016-06-11 DIAGNOSIS — R319 Hematuria, unspecified: Secondary | ICD-10-CM

## 2016-06-11 DIAGNOSIS — E785 Hyperlipidemia, unspecified: Secondary | ICD-10-CM

## 2016-06-11 LAB — POCT URINALYSIS DIPSTICK
Bilirubin, UA: NEGATIVE
Glucose, UA: NEGATIVE
KETONES UA: NEGATIVE
LEUKOCYTES UA: NEGATIVE
Nitrite, UA: NEGATIVE
PH UA: 6
PROTEIN UA: NEGATIVE
Spec Grav, UA: >=1.03
Urobilinogen, UA: 0.2

## 2016-06-11 NOTE — Progress Notes (Signed)
Patient: Sonya Wade Female    DOB: Jan 28, 1946   71 y.o.   MRN: [id number] Visit Date: 06/11/2016  Today's Provider: Lelon Huh, MD   Chief Complaint  Patient presents with  . Hematuria    follow up   Subjective:    HPI Hematuria: Patient was last seen on 06/03/2016 for symptoms of urinary frequency with positive blood on u/a. Patient was started on cipro. Urine culture was negativen. Patient was advised to continue antibiotic a follow up in 1 week to recheck urinalysis.    Follow up cholesterol Lab Results  Component Value Date   CHOL 248 (H) 06/04/2016   HDL 64 06/04/2016   LDLCALC 165 (H) 06/04/2016   TRIG 96 06/04/2016   CHOLHDL 3.9 06/04/2016   Was advised advised to start atorvastatin after reviewing labs last week, but she states she stopped atorvastatin in the past due to pain in knees, but has some left over pravastatin 20mg  which whe tolerates betters. She just started back on it today.     No Known Allergies   Current Outpatient Prescriptions:  .  aspirin 81 MG tablet, Take 81 mg by mouth daily., Disp: , Rfl:  .  Cholecalciferol 1000 UNITS tablet, Take 1,000 Units by mouth daily. , Disp: , Rfl:  .  escitalopram (LEXAPRO) 10 MG tablet, TAKE 1 TABLET BY MOUTH DAILY, Disp: 90 tablet, Rfl: 3 .  fluticasone (FLONASE) 50 MCG/ACT nasal spray, INSTILL TWO SPRAYS INTO EACH NOSTRIL ONCE A DAY, Disp: 48 g, Rfl: 1 .  LORazepam (ATIVAN) 0.5 MG tablet, TAKE 1/2 TO 1 TABLET BY MOUTH TWICE A DAY AS NEEDED, Disp: 30 tablet, Rfl: 5 .  meloxicam (MOBIC) 15 MG tablet, TAKE 1 TABLET (15 MG TOTAL) BY MOUTH ONCE DAILY., Disp: 90 tablet, Rfl: 1 .  Misc Natural Products (OSTEO BI-FLEX/5-LOXIN ADVANCED) TABS, Take 2 tablets by mouth daily. , Disp: , Rfl:  .  Multiple Vitamin (MULTIVITAMIN) tablet, Take 1 tablet by mouth daily., Disp: , Rfl:  .  Omega-3 Fatty Acids (FISH OIL) 1000 MG CAPS, Take by mouth daily., Disp: , Rfl:  .  omeprazole (PRILOSEC) 20 MG capsule,  TAKE ONE CAPSULE BY MOUTH DAILY, Disp: 90 capsule, Rfl: 1 .  pravastatin (PRAVACHOL) 20 MG tablet, Take 20 mg by mouth daily., Disp: , Rfl:   Review of Systems  Constitutional: Negative for appetite change, chills, fatigue and fever.  Respiratory: Negative for chest tightness and shortness of breath.   Cardiovascular: Negative for chest pain and palpitations.  Gastrointestinal: Negative for abdominal pain, nausea and vomiting.  Genitourinary: Positive for frequency (improved since last office visit).  Neurological: Negative for dizziness and weakness.    Social History  Substance Use Topics  . Smoking status: Never Smoker  . Smokeless tobacco: Never Used  . Alcohol use No   Objective:   BP 112/66 (BP Location: Right Arm, Patient Position: Sitting, Cuff Size: Normal)   Pulse (!) 53   Temp 98 F (36.7 C) (Oral)   Resp 16   Wt 169 lb (76.7 kg)   SpO2 98%   BMI 29.01 kg/m   Physical Exam   General Appearance:    Alert, cooperative, no distress  Eyes:    PERRL, conjunctiva/corneas clear, EOM's intact       Lungs:     Clear to auscultation bilaterally, respirations unlabored  Heart:    Regular rate and rhythm  Neurologic:   Awake, alert, oriented x 3. No apparent  focal neurological           defect.       Results for orders placed or performed in visit on 06/11/16  POCT Urinalysis Dipstick  Result Value Ref Range   Color, UA yellow    Clarity, UA clear    Glucose, UA negative    Bilirubin, UA negative    Ketones, UA negative    Spec Grav, UA >=1.030    Blood, UA Moderate (Hemolyzed)    pH, UA 6.0    Protein, UA negative    Urobilinogen, UA 0.2    Nitrite, UA negative    Leukocytes, UA Negative Negative   Micro: Many epis, 2-5 RBC/hpf. 2-5 WBC/hpf    Assessment & Plan:     1. Hematuria, unspecified type She reports doing a lot of heavy lifting lately cleaning up a tree in her yard. Consider low RBC count she may just have myoglobinuria.  Will recheck in a month -  POCT Urinalysis Dipstick  2. Hyperlipidemia, unspecified hyperlipidemia type Has restarted pravastatin and tolerating fairly well. Will check lipids in a month.        Lelon Huh, MD  [facility]

## 2016-06-11 NOTE — Patient Instructions (Addendum)
Return in a month to recheck urinalysis and cholesterol is a month

## 2016-07-03 ENCOUNTER — Telehealth: Payer: Self-pay | Admitting: Family Medicine

## 2016-07-03 DIAGNOSIS — Z1231 Encounter for screening mammogram for malignant neoplasm of breast: Secondary | ICD-10-CM

## 2016-07-03 DIAGNOSIS — E785 Hyperlipidemia, unspecified: Secondary | ICD-10-CM

## 2016-07-03 NOTE — Telephone Encounter (Signed)
Patient was notified. Lab slip is at front desk.

## 2016-07-03 NOTE — Telephone Encounter (Signed)
Please remind patient it is time to check labs. Future labs were entered on 06/11/2016. Please release and print lab order. She has needs u/a when she picks up lab slip for follow up of hematuria.

## 2016-07-09 ENCOUNTER — Other Ambulatory Visit (INDEPENDENT_AMBULATORY_CARE_PROVIDER_SITE_OTHER): Payer: Medicare Other

## 2016-07-09 ENCOUNTER — Ambulatory Visit
Admission: RE | Admit: 2016-07-09 | Discharge: 2016-07-09 | Disposition: A | Discharge status: Home / Self Care | Payer: Medicare Other | Source: Ambulatory Visit | Attending: General Surgery | Admitting: General Surgery

## 2016-07-09 DIAGNOSIS — Z1231 Encounter for screening mammogram for malignant neoplasm of breast: Secondary | ICD-10-CM | POA: Diagnosis not present

## 2016-07-09 DIAGNOSIS — E785 Hyperlipidemia, unspecified: Secondary | ICD-10-CM | POA: Diagnosis not present

## 2016-07-09 DIAGNOSIS — R319 Hematuria, unspecified: Secondary | ICD-10-CM | POA: Diagnosis not present

## 2016-07-09 LAB — POCT URINALYSIS DIPSTICK
BILIRUBIN UA: NEGATIVE
Glucose, UA: NEGATIVE
KETONES UA: NEGATIVE
Leukocytes, UA: NEGATIVE
NITRITE UA: NEGATIVE
PH UA: 6
Protein, UA: NEGATIVE
SPEC GRAV UA: 1.01
Urobilinogen, UA: 0.2

## 2016-07-10 ENCOUNTER — Other Ambulatory Visit: Payer: Self-pay | Admitting: General Surgery

## 2016-07-10 DIAGNOSIS — R928 Other abnormal and inconclusive findings on diagnostic imaging of breast: Secondary | ICD-10-CM

## 2016-07-10 DIAGNOSIS — N6320 Unspecified lump in the left breast, unspecified quadrant: Secondary | ICD-10-CM

## 2016-07-10 LAB — MICROSCOPIC EXAMINATION: CASTS: NONE SEEN /LPF

## 2016-07-10 LAB — URINALYSIS, ROUTINE W REFLEX MICROSCOPIC
Bilirubin, UA: NEGATIVE
GLUCOSE, UA: NEGATIVE
Ketones, UA: NEGATIVE
Nitrite, UA: NEGATIVE
PH UA: 6 (ref 5.0–7.5)
PROTEIN UA: NEGATIVE
Specific Gravity, UA: 1.011 (ref 1.005–1.030)
Urobilinogen, Ur: 0.2 mg/dL (ref 0.2–1.0)

## 2016-07-10 LAB — LIPID PANEL
CHOL/HDL RATIO: 2.8 (ref 0.0–4.4)
Cholesterol, Total: 168 mg/dL (ref 100–199)
HDL: 60 mg/dL (ref >39)
LDL Calculated: 85 (ref 0–99)
Triglycerides: 115 mg/dL (ref 0–149)
VLDL CHOLESTEROL CAL: 23 (ref 5–40)

## 2016-07-10 LAB — HEPATIC FUNCTION PANEL
ALT: 14 IU/L (ref 0–32)
AST: 15 IU/L (ref 0–40)
Albumin: 4.4 g/dL (ref 3.5–4.8)
Alkaline Phosphatase: 75 IU/L (ref 39–117)
Bilirubin Total: 0.3 mg/dL (ref 0.0–1.2)
Bilirubin, Direct: 0.09 mg/dL (ref 0.00–0.40)
TOTAL PROTEIN: 6.8 g/dL (ref 6.0–8.5)

## 2016-07-10 LAB — CK: CK TOTAL: 87 U/L (ref 24–173)

## 2016-07-10 NOTE — Telephone Encounter (Signed)
Advised.  Rx not called in as the patient states she has several refills left.  ED

## 2016-07-12 ENCOUNTER — Ambulatory Visit: Payer: PRIVATE HEALTH INSURANCE | Admitting: Family Medicine

## 2016-07-16 ENCOUNTER — Ambulatory Visit: Payer: Medicare Other | Admitting: General Surgery

## 2016-07-23 ENCOUNTER — Ambulatory Visit: Payer: PRIVATE HEALTH INSURANCE

## 2016-07-30 ENCOUNTER — Ambulatory Visit
Admission: RE | Admit: 2016-07-30 | Discharge: 2016-07-30 | Disposition: A | Discharge status: Home / Self Care | Payer: Medicare Other | Source: Ambulatory Visit | Attending: General Surgery | Admitting: General Surgery

## 2016-07-30 DIAGNOSIS — R928 Other abnormal and inconclusive findings on diagnostic imaging of breast: Secondary | ICD-10-CM | POA: Diagnosis not present

## 2016-07-30 DIAGNOSIS — N6320 Unspecified lump in the left breast, unspecified quadrant: Secondary | ICD-10-CM | POA: Diagnosis present

## 2016-07-30 DIAGNOSIS — R922 Inconclusive mammogram: Secondary | ICD-10-CM | POA: Diagnosis not present

## 2016-07-30 DIAGNOSIS — N6322 Unspecified lump in the left breast, upper inner quadrant: Secondary | ICD-10-CM | POA: Diagnosis not present

## 2016-08-06 ENCOUNTER — Encounter: Payer: Self-pay | Admitting: *Deleted

## 2016-08-10 ENCOUNTER — Other Ambulatory Visit: Payer: Self-pay | Admitting: Family Medicine

## 2016-08-10 DIAGNOSIS — K219 Gastro-esophageal reflux disease without esophagitis: Secondary | ICD-10-CM

## 2016-08-12 NOTE — Telephone Encounter (Signed)
I believe this is Dr Maralyn Sago patient Thanks ED

## 2016-08-12 NOTE — Telephone Encounter (Signed)
Please review-aa 

## 2016-08-13 ENCOUNTER — Inpatient Hospital Stay: Payer: Self-pay

## 2016-08-13 ENCOUNTER — Encounter: Payer: Self-pay | Admitting: General Surgery

## 2016-08-13 ENCOUNTER — Ambulatory Visit (INDEPENDENT_AMBULATORY_CARE_PROVIDER_SITE_OTHER): Payer: Medicare Other | Admitting: General Surgery

## 2016-08-13 VITALS — BP 142/80 | HR 64 | Resp 12 | Ht 64.0 in | Wt 168.0 lb

## 2016-08-13 DIAGNOSIS — Z803 Family history of malignant neoplasm of breast: Secondary | ICD-10-CM | POA: Diagnosis not present

## 2016-08-13 DIAGNOSIS — N6002 Solitary cyst of left breast: Secondary | ICD-10-CM | POA: Diagnosis not present

## 2016-08-13 DIAGNOSIS — N6320 Unspecified lump in the left breast, unspecified quadrant: Secondary | ICD-10-CM

## 2016-08-13 DIAGNOSIS — N6019 Diffuse cystic mastopathy of unspecified breast: Secondary | ICD-10-CM | POA: Diagnosis not present

## 2016-08-13 DIAGNOSIS — Z86018 Personal history of other benign neoplasm: Secondary | ICD-10-CM | POA: Diagnosis not present

## 2016-08-13 HISTORY — PX: BREAST CYST ASPIRATION: SHX578

## 2016-08-13 NOTE — Progress Notes (Signed)
Patient ID: Sonya Wade, female   DOB: [birth year], 71 y.o.   MRN: [id number]  Chief Complaint  Patient presents with  . Follow-up    HPI Sonya Wade is a 71 y.o. female who presents for a breast evaluation. The most recent mammogram was done on 07/30/2016.  Patient does perform regular self breast checks and gets regular mammograms done.   I have reviewed the history of present illness with the patient.  HPI  Past Medical History:  Diagnosis Date  . Benign neoplasm of breast 2011  . Diffuse cystic mastopathy   . Family history of malignant neoplasm of breast   . Lump or mass in breast     Past Surgical History:  Procedure Laterality Date  . BREAST BIOPSY Left 2013   core, apocine cyst  . BREAST BIOPSY Left 2005   intraductal papilloma  . COLONOSCOPY  May 2008   Dr. Jamal Collin  . TUBAL LIGATION  1980    Family History  Problem Relation Age of Onset  . Breast cancer Mother 40  . Diabetes Father   . Hypertension Sister     Colon Polyps    Social History Social History  Substance Use Topics  . Smoking status: Never Smoker  . Smokeless tobacco: Never Used  . Alcohol use No    No Known Allergies  Current Outpatient Prescriptions  Medication Sig Dispense Refill  . aspirin 81 MG tablet Take 81 mg by mouth daily.    . Cholecalciferol 1000 UNITS tablet Take 1,000 Units by mouth daily.     Marland Kitchen escitalopram (LEXAPRO) 10 MG tablet TAKE 1 TABLET BY MOUTH DAILY 90 tablet 3  . fluticasone (FLONASE) 50 MCG/ACT nasal spray INSTILL TWO SPRAYS INTO EACH NOSTRIL ONCE A DAY 48 g 1  . LORazepam (ATIVAN) 0.5 MG tablet TAKE 1/2 TO 1 TABLET BY MOUTH TWICE A DAY AS NEEDED 30 tablet 5  . meloxicam (MOBIC) 15 MG tablet TAKE 1 TABLET (15 MG TOTAL) BY MOUTH ONCE DAILY. 90 tablet 1  . Misc Natural Products (OSTEO BI-FLEX/5-LOXIN ADVANCED) TABS Take 2 tablets by mouth daily.     . Multiple Vitamin (MULTIVITAMIN) tablet Take 1 tablet by mouth daily.    . Omega-3 Fatty Acids  (FISH OIL) 1000 MG CAPS Take by mouth daily.    Marland Kitchen omeprazole (PRILOSEC) 20 MG capsule TAKE ONE CAPSULE BY MOUTH DAILY 90 capsule 3  . pravastatin (PRAVACHOL) 20 MG tablet Take 20 mg by mouth daily.     No current facility-administered medications for this visit.     Review of Systems Review of Systems  Constitutional: Negative.   Respiratory: Negative.   Cardiovascular: Negative.     Blood pressure (!) 142/80, pulse 64, resp. rate 12, height 5\' 4"  (1.626 m), weight 168 lb (76.2 kg).  Physical Exam Physical Exam  Constitutional: She is oriented to person, place, and time. She appears well-developed and well-nourished.  Eyes: Conjunctivae are normal. No scleral icterus.  Neck: Neck supple.  Cardiovascular: Normal rate, regular rhythm and normal heart sounds.   Pulmonary/Chest: Effort normal and breath sounds normal. Right breast exhibits no inverted nipple, no mass, no nipple discharge, no skin change and no tenderness. Left breast exhibits no inverted nipple, no nipple discharge, no skin change and no tenderness.  Abdominal: Soft. Bowel sounds are normal. There is no tenderness.  Lymphadenopathy:    She has no cervical adenopathy.  Neurological: She is alert and oriented to person, place, and time.  Skin:  Skin is warm.    Data Reviewed Mammogram and [place] reviewed  Cyst like structure at 10 ocl. Assessment    Left breast finding -likely benign. S/p excision intraductal papilloma left breast FH breast cancer    Plan   With consent [place] left breast periareolar 10 ocl region was performed. A cystic 1 cm area noted at 10ocl 2cmfn. No solid component noted. With consent the cyst was aspirated with 22 g needle- 0,59ml of thick reddish brown fluid removed. Cyst resolved fully. Slides sent for cytology.  Return in three months.     This information has been scribed by Gaspar Cola CMA.   [place] Karl G 08/13/2016, 11:46 AM

## 2016-08-13 NOTE — Patient Instructions (Signed)
Return in three months.

## 2016-08-15 ENCOUNTER — Telehealth: Payer: Self-pay | Admitting: *Deleted

## 2016-08-15 NOTE — Telephone Encounter (Signed)
Notified patient as instructed, patient pleased. Discussed follow-up appointments, patient agrees  

## 2016-08-15 NOTE — Telephone Encounter (Signed)
-----   Message from Christene Lye, MD sent at 08/15/2016  8:22 AM EDT ----- Cytology benign. Follow up as scheduled. Please notify pt.

## 2016-11-11 ENCOUNTER — Inpatient Hospital Stay: Payer: Self-pay

## 2016-11-11 ENCOUNTER — Ambulatory Visit (INDEPENDENT_AMBULATORY_CARE_PROVIDER_SITE_OTHER): Payer: Medicare Other | Admitting: General Surgery

## 2016-11-11 VITALS — BP 118/70 | HR 70 | Resp 14 | Ht 64.0 in | Wt 165.0 lb

## 2016-11-11 DIAGNOSIS — Z803 Family history of malignant neoplasm of breast: Secondary | ICD-10-CM

## 2016-11-11 DIAGNOSIS — N6002 Solitary cyst of left breast: Secondary | ICD-10-CM

## 2016-11-11 DIAGNOSIS — Z86018 Personal history of other benign neoplasm: Secondary | ICD-10-CM

## 2016-11-11 NOTE — Patient Instructions (Signed)
Pt to return in 3 months for follow up. Advised to call office with questions or concerns.

## 2016-11-11 NOTE — Progress Notes (Signed)
Patient ID: Sonya Wade, female   DOB: 16-Jan-1946, 72 y.o.   MRN: [id number]  No chief complaint on file.   HPI Sonya Wade is a 71 y.o. female here today for her follow up left breast cyst. 3 mos ago she has imaging showing a complicated cyst in left breast. The cyst was aspirated and some thick fluid removed-cytology with no cells seen. Pt with no complaints HPI  Past Medical History:  Diagnosis Date  . Benign neoplasm of breast 2011  . Diffuse cystic mastopathy   . Family history of malignant neoplasm of breast   . Lump or mass in breast     Past Surgical History:  Procedure Laterality Date  . BREAST BIOPSY Left 2013   core, apocine cyst  . BREAST BIOPSY Left 2005   intraductal papilloma  . COLONOSCOPY  May 2008   Dr. Jamal Collin  . TUBAL LIGATION  1980    Family History  Problem Relation Age of Onset  . Breast cancer Mother 21  . Diabetes Father   . Hypertension Sister        Colon Polyps    Social History Social History  Substance Use Topics  . Smoking status: Never Smoker  . Smokeless tobacco: Never Used  . Alcohol use No    No Known Allergies  Current Outpatient Prescriptions  Medication Sig Dispense Refill  . aspirin 81 MG tablet Take 81 mg by mouth daily.    . Cholecalciferol 1000 UNITS tablet Take 1,000 Units by mouth daily.     Marland Kitchen escitalopram (LEXAPRO) 10 MG tablet TAKE 1 TABLET BY MOUTH DAILY 90 tablet 3  . fluticasone (FLONASE) 50 MCG/ACT nasal spray INSTILL TWO SPRAYS INTO EACH NOSTRIL ONCE A DAY 48 g 1  . LORazepam (ATIVAN) 0.5 MG tablet TAKE 1/2 TO 1 TABLET BY MOUTH TWICE A DAY AS NEEDED 30 tablet 5  . meloxicam (MOBIC) 15 MG tablet TAKE 1 TABLET (15 MG TOTAL) BY MOUTH ONCE DAILY. 90 tablet 1  . Misc Natural Products (OSTEO BI-FLEX/5-LOXIN ADVANCED) TABS Take 2 tablets by mouth daily.     . Multiple Vitamin (MULTIVITAMIN) tablet Take 1 tablet by mouth daily.    . Omega-3 Fatty Acids (FISH OIL) 1000 MG CAPS Take by mouth daily.     Marland Kitchen omeprazole (PRILOSEC) 20 MG capsule TAKE ONE CAPSULE BY MOUTH DAILY 90 capsule 3  . pravastatin (PRAVACHOL) 20 MG tablet Take 20 mg by mouth daily.     No current facility-administered medications for this visit.     Review of Systems Review of Systems  Constitutional: Negative.   Respiratory: Negative.   Cardiovascular: Negative.     Blood pressure 118/70, pulse 70, resp. rate 14, height 5\' 4"  (1.626 m), weight 165 lb (74.8 kg).  Physical Exam Physical Exam  Constitutional: She is oriented to person, place, and time. She appears well-developed and well-nourished.  Pulmonary/Chest: Right breast exhibits no inverted nipple, no mass, no nipple discharge, no skin change and no tenderness. Left breast exhibits no inverted nipple, no mass, no nipple discharge, no skin change and no tenderness.  Lymphadenopathy:    She has no axillary adenopathy.  Neurological: She is alert and oriented to person, place, and time.  Skin: Skin is warm and dry.  Psychiatric: She has a normal mood and affect. Her behavior is normal.    Data Reviewed Prior notes reviewed. Repeated US of left breast near areola. The cyst seen and aspirated before has recurred and measures  less than a cm. It seems to be anechoic with minimal through transmission. This is a benign cyst  Assessment Left breast cyst- previous cytology normal. Not palpable, only visible on ultrasound. Location 10 o'clock, approx 2 cmfn. S/p excision intraductal papilloma left breast FH breast cancer  Plan    Patient to return in 3-4 mos. The patient is aware to call back for any questions or concerns.      HPI, Physical Exam, Assessment and Plan have been scribed under the direction and in the presence of Mckinley Jewel, MD  Gaspar Cola, CMA  I have completed the exam and reviewed the above documentation for accuracy and completeness.  I agree with the above.  Haematologist has been used and any errors in dictation or  transcription are unintentional.  Leanne Sisler G. Jamal Collin, M.D., F.A.C.S.   Junie Panning G 11/11/2016, 10:53 AM

## 2016-12-23 ENCOUNTER — Ambulatory Visit: Payer: Medicare Other | Admitting: Family Medicine

## 2016-12-25 ENCOUNTER — Ambulatory Visit (INDEPENDENT_AMBULATORY_CARE_PROVIDER_SITE_OTHER): Payer: Medicare Other | Admitting: Family Medicine

## 2016-12-25 ENCOUNTER — Encounter: Payer: Self-pay | Admitting: Family Medicine

## 2016-12-25 VITALS — BP 102/60 | HR 60 | Temp 98.0°F | Resp 16 | Wt 165.0 lb

## 2016-12-25 DIAGNOSIS — J302 Other seasonal allergic rhinitis: Secondary | ICD-10-CM | POA: Diagnosis not present

## 2016-12-25 DIAGNOSIS — E785 Hyperlipidemia, unspecified: Secondary | ICD-10-CM

## 2016-12-25 DIAGNOSIS — K219 Gastro-esophageal reflux disease without esophagitis: Secondary | ICD-10-CM

## 2016-12-25 DIAGNOSIS — F329 Major depressive disorder, single episode, unspecified: Secondary | ICD-10-CM | POA: Diagnosis not present

## 2016-12-25 DIAGNOSIS — F419 Anxiety disorder, unspecified: Secondary | ICD-10-CM

## 2016-12-25 MED ORDER — RANITIDINE HCL 150 MG PO TABS: 150.0000 mg | ORAL_TABLET | Freq: Two times a day (BID) | ORAL | 3 refills | Status: DC | PRN

## 2016-12-25 MED ORDER — FLUTICASONE PROPIONATE 50 MCG/ACT NA SUSP: 2.0000 | Freq: Every day | NASAL | 1 refills | Status: DC

## 2016-12-25 NOTE — Progress Notes (Signed)
Patient: Sonya Wade Female    DOB: [birth year]   71 y.o.   MRN: [id number] Visit Date: 12/25/2016  Today's Provider: Lavon Paganini, MD   Chief Complaint  Patient presents with  . Establish Care  . Hyperlipidemia  . Anxiety  . Gastroesophageal Reflux   Subjective:    HPI   Establish Care Pt is here to establish care with new primary care provider. She previously saw Dr. Venia Minks and Dr. Caryn Section at [facility]. She feels fairly well, but is waking up with a frontal headache. She believes this is sinus related. She is also experiencing nasal congestion, some drainage that causes cough. She needs a refill on her Flonase. No snoring, breathing difficulties, vision changes, weakness, numbness, speech changes.      Lipid/Cholesterol, Follow-up:   Last seen for this 6 months ago.  Management changes since that visit include checking labs, which were improved. Dr. Caryn Section advised pt to continue Pravastatin 20 mg. . Last Lipid Panel:    Component Value Date/Time   CHOL 168 07/09/2016 1009   CHOL 234 (H) 08/30/2013 0346   TRIG 115 07/09/2016 1009   TRIG 313 (H) 08/30/2013 0346   HDL 60 07/09/2016 1009   HDL 40 08/30/2013 0346   CHOLHDL 2.8 07/09/2016 1009   VLDL 63 (H) 08/30/2013 0346   LDLCALC 85 07/09/2016 1009   LDLCALC 131 (H) 08/30/2013 0346    Risk factors for vascular disease include hypercholesterolemia  She reports excellent compliance with treatment. She is not having side effects.  Weight trend: stable Current diet: in general, a "healthy" diet   Current exercise: walking  Wt Readings from Last 3 Encounters:  12/25/16 165 lb (74.8 kg)  11/11/16 165 lb (74.8 kg)  08/13/16 168 lb (76.2 kg)    ------------------------------------------------------------------- Anxiety: Patient complains of anxiety disorder.  She has the following symptoms: none. Onset of symptoms was approximately several years ago, stable since that time. She  denies current suicidal and homicidal ideation. Previous treatment includes Ativan and Lexapro. She complains of the following side effects from the treatment: none.  She rarely takes Ativan and knows risks of falls and AMS.  Feels it is well controlled currently.   GERD, Follow up:  The patient was last seen for GERD 7 months ago. Changes made since that visit include continuing Omeprazole.  She reports good compliance with treatment. She takes this medication PRN. She is having side effects.   She IS experiencing no symptoms.   ------------------------------------------------------------------------    No Known Allergies   Current Outpatient Prescriptions:  .  aspirin 81 MG tablet, Take 81 mg by mouth daily., Disp: , Rfl:  .  Cholecalciferol 1000 UNITS tablet, Take 1,000 Units by mouth daily. , Disp: , Rfl:  .  escitalopram (LEXAPRO) 10 MG tablet, TAKE 1 TABLET BY MOUTH DAILY, Disp: 90 tablet, Rfl: 3 .  fluticasone (FLONASE) 50 MCG/ACT nasal spray, INSTILL TWO SPRAYS INTO EACH NOSTRIL ONCE A DAY, Disp: 48 g, Rfl: 1 .  LORazepam (ATIVAN) 0.5 MG tablet, TAKE 1/2 TO 1 TABLET BY MOUTH TWICE A DAY AS NEEDED, Disp: 30 tablet, Rfl: 5 .  meloxicam (MOBIC) 15 MG tablet, TAKE 1 TABLET (15 MG TOTAL) BY MOUTH ONCE DAILY., Disp: 90 tablet, Rfl: 1 .  Misc Natural Products (OSTEO BI-FLEX/5-LOXIN ADVANCED) TABS, Take 2 tablets by mouth daily. , Disp: , Rfl:  .  Multiple Vitamin (MULTIVITAMIN) tablet, Take 1 tablet by mouth daily., Disp: , Rfl:  .  Omega-3 Fatty Acids (FISH OIL) 1000 MG CAPS, Take by mouth daily., Disp: , Rfl:  .  omeprazole (PRILOSEC) 20 MG capsule, TAKE ONE CAPSULE BY MOUTH DAILY, Disp: 90 capsule, Rfl: 3 .  pravastatin (PRAVACHOL) 20 MG tablet, Take 20 mg by mouth daily., Disp: , Rfl:   Review of Systems  Constitutional: Positive for diaphoresis. Negative for activity change, appetite change, chills, fatigue, fever and unexpected weight change.  HENT: Positive for congestion  and sinus pain.   Respiratory: Negative for shortness of breath.   Cardiovascular: Negative for chest pain, palpitations and leg swelling.  Neurological: Positive for headaches.  Psychiatric/Behavioral: Negative for confusion, decreased concentration, sleep disturbance and suicidal ideas. The patient is not nervous/anxious.     Social History  Substance Use Topics  . Smoking status: Never Smoker  . Smokeless tobacco: Never Used  . Alcohol use No   Objective:   BP 102/60 (BP Location: Left Arm, Patient Position: Sitting, Cuff Size: Normal)   Pulse 60   Temp 98 F (36.7 C) (Oral)   Resp 16   Wt 165 lb (74.8 kg)   BMI 28.32 kg/m  Vitals:   12/25/16 1102  BP: 102/60  Pulse: 60  Resp: 16  Temp: 98 F (36.7 C)  TempSrc: Oral  Weight: 165 lb (74.8 kg)     Physical Exam  Constitutional: She is oriented to person, place, and time. She appears well-developed and well-nourished. No distress.  HENT:  Head: Normocephalic and atraumatic.  Nose: Nose normal.  Mouth/Throat: Oropharynx is clear and moist.  Eyes: Pupils are equal, round, and reactive to light. Conjunctivae and EOM are normal. No scleral icterus.  Neck: Neck supple.  Cardiovascular: Normal rate, regular rhythm and normal heart sounds.   No murmur heard. Pulmonary/Chest: Effort normal and breath sounds normal. No respiratory distress. She has no wheezes.  Abdominal: Soft. Bowel sounds are normal. She exhibits no distension. There is no tenderness. There is no rebound.  Musculoskeletal: She exhibits no edema or deformity.  Lymphadenopathy:    She has no cervical adenopathy.  Neurological: She is alert and oriented to person, place, and time. No cranial nerve deficit.  Skin: Skin is warm and dry. No rash noted.  Psychiatric: She has a normal mood and affect. Her behavior is normal.  Vitals reviewed.       Assessment & Plan:          Esophageal reflux Well controlled with prn use of PPI We will switch to prn  use of H2 blocker to avoid risk of osteoporosis in postmenopausal female  Allergic rhinitis Agree with patient that headaches seem to be related to congestion and allergic rhinitis Resume flonase and zyrtec  Hyperlipemia Well controlled Continue pravastatin Recheck lipids in 1 yr from last panel  Anxiety and depression Well controlled Reviewed risks of chronic benzo use Patient using very sparingly No changes currently, but could consider increase in lexapro if control is suboptimal in the future F/u in 6 months    Lavon Paganini, MD  [place]

## 2016-12-25 NOTE — Assessment & Plan Note (Signed)
Well controlled Continue pravastatin Recheck lipids in 1 yr from last panel

## 2016-12-25 NOTE — Assessment & Plan Note (Signed)
Well controlled with prn use of PPI We will switch to prn use of H2 blocker to avoid risk of osteoporosis in postmenopausal female

## 2016-12-25 NOTE — Assessment & Plan Note (Signed)
Well controlled Reviewed risks of chronic benzo use Patient using very sparingly No changes currently, but could consider increase in lexapro if control is suboptimal in the future F/u in 6 months

## 2016-12-25 NOTE — Assessment & Plan Note (Signed)
Agree with patient that headaches seem to be related to congestion and allergic rhinitis Resume flonase and zyrtec

## 2016-12-25 NOTE — Patient Instructions (Signed)
Stop omeprazole and use zantac (ranitidine) twice daily as needed  Restart zyrtec and flonase  Continue lexapro for anxiety

## 2017-01-16 ENCOUNTER — Ambulatory Visit (INDEPENDENT_AMBULATORY_CARE_PROVIDER_SITE_OTHER): Payer: Medicare Other | Admitting: Family Medicine

## 2017-01-16 ENCOUNTER — Encounter: Payer: Self-pay | Admitting: Family Medicine

## 2017-01-16 VITALS — BP 102/68 | HR 80 | Temp 97.9°F | Resp 16 | Wt 166.0 lb

## 2017-01-16 DIAGNOSIS — M170 Bilateral primary osteoarthritis of knee: Secondary | ICD-10-CM | POA: Diagnosis not present

## 2017-01-16 DIAGNOSIS — M1710 Unilateral primary osteoarthritis, unspecified knee: Secondary | ICD-10-CM

## 2017-01-16 DIAGNOSIS — E785 Hyperlipidemia, unspecified: Secondary | ICD-10-CM | POA: Diagnosis not present

## 2017-01-16 DIAGNOSIS — M179 Osteoarthritis of knee, unspecified: Secondary | ICD-10-CM

## 2017-01-16 MED ORDER — MELOXICAM 15 MG PO TABS
15.0000 mg | ORAL_TABLET | Freq: Every day | ORAL | 0 refills | Status: DC | PRN
Start: 2017-01-16 — End: 2017-03-26

## 2017-01-16 NOTE — Progress Notes (Signed)
Patient: Sonya Wade Female    DOB: July 19, 1945   71 y.o.   MRN: [id number] Visit Date: 01/16/2017  Today's Provider: Lavon Paganini, MD   Chief Complaint  Patient presents with  . Knee Pain   Subjective:    Knee Pain   There was no injury mechanism (Pain has been present for 2 weeks). The pain is present in the left knee. The quality of the pain is described as aching. The pain is at a severity of 8/10. The pain is severe. The pain has been fluctuating since onset. Pertinent negatives include no inability to bear weight, loss of motion, loss of sensation, muscle weakness, numbness or tingling. The symptoms are aggravated by movement. Treatments tried: Meloxicam, Tylenol. The treatment provided moderate relief.  Pt believes the pain is secondary to Statin, so she D/C the medication. She is requesting injection in the knee.     No Known Allergies   Current Outpatient Prescriptions:  .  aspirin 81 MG tablet, Take 81 mg by mouth daily., Disp: , Rfl:  .  Cholecalciferol 1000 UNITS tablet, Take 1,000 Units by mouth daily. , Disp: , Rfl:  .  escitalopram (LEXAPRO) 10 MG tablet, TAKE 1 TABLET BY MOUTH DAILY, Disp: 90 tablet, Rfl: 3 .  fluticasone (FLONASE) 50 MCG/ACT nasal spray, Place 2 sprays into both nostrils daily., Disp: 48 g, Rfl: 1 .  Misc Natural Products (OSTEO BI-FLEX/5-LOXIN ADVANCED) TABS, Take 2 tablets by mouth daily. , Disp: , Rfl:  .  Multiple Vitamin (MULTIVITAMIN) tablet, Take 1 tablet by mouth daily., Disp: , Rfl:  .  Omega-3 Fatty Acids (FISH OIL) 1000 MG CAPS, Take by mouth daily., Disp: , Rfl:  .  ranitidine (ZANTAC) 150 MG tablet, Take 1 tablet (150 mg total) by mouth 2 (two) times daily as needed for heartburn., Disp: 60 tablet, Rfl: 3 .  meloxicam (MOBIC) 15 MG tablet, TAKE 1 TABLET (15 MG TOTAL) BY MOUTH ONCE DAILY. (Patient not taking: Reported on 01/16/2017), Disp: 90 tablet, Rfl: 1 .  pravastatin (PRAVACHOL) 20 MG tablet, Take 20 mg by mouth  daily., Disp: , Rfl:   Review of Systems  Constitutional: Negative for activity change, appetite change, chills, diaphoresis, fatigue, fever and unexpected weight change.  Cardiovascular: Negative for chest pain, palpitations and leg swelling.  Musculoskeletal: Positive for arthralgias and joint swelling.  Neurological: Negative for tingling and numbness.    Social History  Substance Use Topics  . Smoking status: Never Smoker  . Smokeless tobacco: Never Used  . Alcohol use No   Objective:   BP 102/68 (BP Location: Left Arm, Patient Position: Sitting, Cuff Size: Large)   Pulse 80   Temp 97.9 F (36.6 C) (Oral)   Resp 16   Wt 166 lb (75.3 kg)   BMI 28.49 kg/m  Vitals:   01/16/17 0800  BP: 102/68  Pulse: 80  Resp: 16  Temp: 97.9 F (36.6 C)  TempSrc: Oral  Weight: 166 lb (75.3 kg)     Physical Exam  Constitutional: She is oriented to person, place, and time. She appears well-developed and well-nourished. No distress.  HENT:  Head: Normocephalic and atraumatic.  Right Ear: External ear normal.  Left Ear: External ear normal.  Eyes: Conjunctivae are normal. No scleral icterus.  Cardiovascular: Normal rate and regular rhythm.   Pulmonary/Chest: Effort normal. No respiratory distress.  Musculoskeletal: She exhibits no edema or deformity.  L knee: TTP over lateral joint line. Medial joint swelling.  ROM  intact. Strength intact. Collateral ligaments intact. +crepitus.  Neurological: She is alert and oriented to person, place, and time.  Skin: Skin is warm and dry. No rash noted.  Psychiatric: She has a normal mood and affect. Her behavior is normal.  Vitals reviewed.   Last Knee XRays b/l Standing in 2013 shows b/a medial compartment joint space narrowing and lateral compartment osteophytosis.    Assessment & Plan:     Hyperlipemia OK to resume pravastatin as pain is likely from OA  Arthritis of knee, degenerative Knee pain c/w OA pain OK to take Meloxicam  intermittently as needed Steroid injection completed today - no NSAIDs x48h Tylenol ok to take Easy ROM exercises x24-48 h and then resume normal activities F/u as needed       The entirety of the information documented in the History of Present Illness, Review of Systems and Physical Exam were personally obtained by me. Portions of this information were initially documented by Raquel Sarna Ratchford, CMA and reviewed by me for thoroughness and accuracy.     Lavon Paganini, MD  [facility]

## 2017-01-16 NOTE — Patient Instructions (Signed)

## 2017-01-16 NOTE — Assessment & Plan Note (Signed)
Knee pain c/w OA pain OK to take Meloxicam intermittently as needed Steroid injection completed today - no NSAIDs x48h Tylenol ok to take Easy ROM exercises x24-48 h and then resume normal activities F/u as needed

## 2017-01-16 NOTE — Progress Notes (Signed)
Aspiration/Injection Procedure Note Norabelle Kondo [id number] May 29, 1945  Procedure: Injection Indications: L knee pain, swelling, OA  Procedure Details Consent: Risks of procedure as well as the alternatives and risks of each were explained to the (patient/caregiver).  Consent for procedure obtained. (verbally) Time Out: Verified patient identification, verified procedure, site/side was marked, verified correct patient position, special equipment/implants available, medications/allergies/relevent history reviewed, required imaging and test results available.  Performed   Local Anesthesia Used:Lidocaine 1% plain; 12mL and Ethyl Chloride Spray 42mL of 80mg  of depomedrol injected into L knee using lateral approach after cleaning area in typical fashion A sterile dressing was applied.  Patient did tolerate procedure well. Estimated blood loss: 0  Lavon Paganini MD MPH 01/16/2017, 8:42 AM

## 2017-01-16 NOTE — Assessment & Plan Note (Signed)
OK to resume pravastatin as pain is likely from OA

## 2017-01-23 ENCOUNTER — Telehealth: Payer: Self-pay | Admitting: Family Medicine

## 2017-01-23 NOTE — Telephone Encounter (Signed)
Tried calling pt. No answer. Will try again later.

## 2017-01-23 NOTE — Telephone Encounter (Signed)
Pt stated that she got the shingles shot 2 years ago or so and she is asking if she needs to get the new shingles shot. Please advise. Thanks TNP

## 2017-02-04 ENCOUNTER — Other Ambulatory Visit: Payer: Self-pay | Admitting: General Surgery

## 2017-02-04 ENCOUNTER — Encounter: Payer: Self-pay | Admitting: General Surgery

## 2017-02-04 ENCOUNTER — Ambulatory Visit (INDEPENDENT_AMBULATORY_CARE_PROVIDER_SITE_OTHER): Payer: Medicare Other | Admitting: General Surgery

## 2017-02-04 ENCOUNTER — Inpatient Hospital Stay: Payer: Self-pay

## 2017-02-04 VITALS — BP 138/68 | HR 62 | Resp 14 | Ht 64.0 in | Wt 167.0 lb

## 2017-02-04 DIAGNOSIS — N6320 Unspecified lump in the left breast, unspecified quadrant: Secondary | ICD-10-CM

## 2017-02-04 DIAGNOSIS — N6032 Fibrosclerosis of left breast: Secondary | ICD-10-CM | POA: Diagnosis not present

## 2017-02-04 DIAGNOSIS — N6322 Unspecified lump in the left breast, upper inner quadrant: Secondary | ICD-10-CM

## 2017-02-04 DIAGNOSIS — N6002 Solitary cyst of left breast: Secondary | ICD-10-CM

## 2017-02-04 HISTORY — PX: BREAST BIOPSY: SHX20

## 2017-02-04 NOTE — Progress Notes (Signed)
Patient ID: Sonya Wade, female   DOB: 08-22-1945, 71 y.o.   MRN: [id number]  Chief Complaint  Patient presents with  . Follow-up    HPI Sonya Wade is a 71 y.o. female here today for her follow up breast cyst. Patient states no change in her left breast and denies breast pain. FH breast cancer.   HPI  Past Medical History:  Diagnosis Date  . Benign neoplasm of breast 2011  . Diffuse cystic mastopathy   . Family history of malignant neoplasm of breast   . Lump or mass in breast     Past Surgical History:  Procedure Laterality Date  . BREAST BIOPSY Left 2013   core, apocine cyst  . BREAST BIOPSY Left 2005   intraductal papilloma  . COLONOSCOPY  May 2008   Dr. Jamal Collin  . TUBAL LIGATION  1980    Family History  Problem Relation Age of Onset  . Breast cancer Mother 35  . Diabetes Father   . Hypertension Sister        Colon Polyps    Social History Social History  Substance Use Topics  . Smoking status: Never Smoker  . Smokeless tobacco: Never Used  . Alcohol use No    No Known Allergies  Current Outpatient Prescriptions  Medication Sig Dispense Refill  . aspirin 81 MG tablet Take 81 mg by mouth daily.    . Cholecalciferol 1000 UNITS tablet Take 1,000 Units by mouth daily.     Marland Kitchen escitalopram (LEXAPRO) 10 MG tablet TAKE 1 TABLET BY MOUTH DAILY 90 tablet 3  . fluticasone (FLONASE) 50 MCG/ACT nasal spray Place 2 sprays into both nostrils daily. 48 g 1  . meloxicam (MOBIC) 15 MG tablet Take 1 tablet (15 mg total) by mouth daily as needed for pain. 90 tablet 0  . Misc Natural Products (OSTEO BI-FLEX/5-LOXIN ADVANCED) TABS Take 2 tablets by mouth daily.     . Multiple Vitamin (MULTIVITAMIN) tablet Take 1 tablet by mouth daily.    . Omega-3 Fatty Acids (FISH OIL) 1000 MG CAPS Take by mouth daily.    . pravastatin (PRAVACHOL) 20 MG tablet Take 20 mg by mouth daily.    . ranitidine (ZANTAC) 150 MG tablet Take 1 tablet (150 mg total) by mouth 2 (two)  times daily as needed for heartburn. 60 tablet 3   No current facility-administered medications for this visit.     Review of Systems Review of Systems  Constitutional: Negative.   Respiratory: Negative.   Cardiovascular: Negative.     Blood pressure 138/68, pulse 62, resp. rate 14, height 5\' 4"  (1.626 m), weight 167 lb (75.8 kg).  Physical Exam Physical Exam  Constitutional: She is oriented to person, place, and time. She appears well-developed and well-nourished.  Pulmonary/Chest: Right breast exhibits no inverted nipple, no mass, no nipple discharge, no skin change and no tenderness. Left breast exhibits no inverted nipple, no mass, no nipple discharge, no skin change and no tenderness.    Lymphadenopathy:    She has no axillary adenopathy.  Neurological: She is alert and oriented to person, place, and time.  Skin: Skin is warm and dry.    Data Reviewed Prior mammogram, [place], and notes reviewed  Assessment    Left breast cyst - Patient has a history of a 10 o'clock approximately 2 CMFN cyst that was aspirated with negative cytology on 08/13/2016. [place] in clinic today showed marginally increased growth of the cyst and small area of dense  tissue growing into the cyst that is new, possibly suspicious for another papilloma. Cor biopsy of left breast was done in clinic today with consent  S/p excision intraductal papilloma of left breast - no nipple discharge today, well healed.     CORE BREAST BIOPSY REPORT  Name:  Sonya Wade DOB:  1946-05-19  Vital signs:BP 138/68   Pulse 62   Resp 14   Ht 5\' 4"  (1.626 m)   Wt 167 lb (75.8 kg)   BMI 28.67 kg/m   Lesion: Left breast complex cyst  Location:  Left  10 o'clock 2CMFN  Local anesthetic:   8 ml of 1% Xylocaine/0.5% Marcaine  Prep:  Chloroprep   Device:  14 Gauge  Bard   Ultrasound guidance:  Used  Patient tolerance: Patient tolerated the procedure well  Approach: LM   Clip: Placed  Dressing: Steri  strips. Telfa tegaderm   Ice pack applied. Written instructions provided to patient regarding wound care.   Patient advised that patient will be contacted by phone when pathology report is available.  Followup appointment to be scheduled after pathology.   CC: Bacigalupo, Dionne Bucy, MD, [place]    Plan    Patient can continue to shower. Apply ice pack and wear supportive sports bra. Return to clinic if needed, pending pathology results.     HPI, Physical Exam, Assessment and Plan have been scribed under the direction and in the presence of Mckinley Jewel, MD  Gaspar Cola, CMA  The patient is scheduled for a post biopsy mammogram at [facility] on 02/05/17 at 11:20 am. Documented by Lesly Rubenstein LPN  I have completed the exam and reviewed the above documentation for accuracy and completeness.  I agree with the above.  Haematologist has been used and any errors in dictation or transcription are unintentional.  Seeplaputhur G. Jamal Collin, M.D., F.A.C.S. Junie Panning G 02/04/2017, 11:48 AM

## 2017-02-04 NOTE — Patient Instructions (Addendum)
CARE AFTER BREAST BIOPSY  1. Leave the dressing on that your doctor applied after the biopsy. It is waterproof. You may bathe, shower and/or swim. The dressing can be removed in 3 days, you will see small strips of tape against your skin on the incision. Do not remove these strips they will gradually fall off in about 2-3 weeks. You may use an ice pack on and off for the first 12-24 hours for comfort.  2. You may want to use a gauze,cloth or similar protection in your bra to prevent rubbing against your dressing and incision. This is not necessary, but you may feel more comfortable doing so.  3. It is recommended that you wear a bra day and night to give support to the breast. This will prevent the weight of the breast from pulling on the incision.  4. Your breast may feel hard and lumpy under the incision. Do not be alarmed. This is the underlying stitching of tissue. Softening of this tissue will occur in time.  5. You may have a follow up appointment or phone follow up in one week after your biopsy. The office phone number is [phone].  6. You will notice about a week or two after your office visit that the strips of the tape on your incision will begin to loosen. These may then be removed.  7. Report to your doctor any of the following:  * Severe pain not relieved by your pain medication  *Redness of the incision  * Drainage from the incision  *Fever greater than 101 degrees  The patient is scheduled for a post biopsy mammogram at [facility] on 02/05/17 at 11:20 am.

## 2017-02-05 ENCOUNTER — Ambulatory Visit
Admission: RE | Admit: 2017-02-05 | Discharge: 2017-02-05 | Disposition: A | Discharge status: Home / Self Care | Payer: Medicare Other | Source: Ambulatory Visit | Attending: General Surgery | Admitting: General Surgery

## 2017-02-05 DIAGNOSIS — N6322 Unspecified lump in the left breast, upper inner quadrant: Secondary | ICD-10-CM | POA: Diagnosis not present

## 2017-02-05 DIAGNOSIS — N6320 Unspecified lump in the left breast, unspecified quadrant: Secondary | ICD-10-CM

## 2017-02-12 ENCOUNTER — Telehealth: Payer: Self-pay | Admitting: *Deleted

## 2017-02-12 NOTE — Telephone Encounter (Signed)
Notified patient as instructed, patient pleased. Discussed follow-up appointments, patient agrees  

## 2017-02-12 NOTE — Telephone Encounter (Signed)
-----   Message from Christene Lye, MD sent at 02/11/2017 12:55 PM EDT ----- Inform pt path is benign. Follow up in 2 mos.

## 2017-02-18 DIAGNOSIS — Z23 Encounter for immunization: Secondary | ICD-10-CM | POA: Diagnosis not present

## 2017-02-20 DIAGNOSIS — H25013 Cortical age-related cataract, bilateral: Secondary | ICD-10-CM | POA: Diagnosis not present

## 2017-03-19 DIAGNOSIS — H25011 Cortical age-related cataract, right eye: Secondary | ICD-10-CM | POA: Diagnosis not present

## 2017-03-21 ENCOUNTER — Other Ambulatory Visit: Payer: Self-pay | Admitting: Physician Assistant

## 2017-03-21 DIAGNOSIS — E785 Hyperlipidemia, unspecified: Secondary | ICD-10-CM

## 2017-03-31 ENCOUNTER — Encounter: Payer: Self-pay | Admitting: *Deleted

## 2017-04-02 ENCOUNTER — Ambulatory Visit: Payer: Medicare Other | Admitting: Anesthesiology

## 2017-04-02 ENCOUNTER — Encounter
Admission: RE | Disposition: A | Discharge status: Home / Self Care | Payer: Self-pay | Source: Ambulatory Visit | Attending: Ophthalmology

## 2017-04-02 ENCOUNTER — Encounter: Payer: Self-pay | Admitting: *Deleted

## 2017-04-02 ENCOUNTER — Ambulatory Visit
Admission: RE | Admit: 2017-04-02 | Discharge: 2017-04-02 | Disposition: A | Discharge status: Home / Self Care | Payer: Medicare Other | Source: Ambulatory Visit | Attending: Ophthalmology | Admitting: Ophthalmology

## 2017-04-02 DIAGNOSIS — H25011 Cortical age-related cataract, right eye: Secondary | ICD-10-CM | POA: Diagnosis not present

## 2017-04-02 DIAGNOSIS — Z803 Family history of malignant neoplasm of breast: Secondary | ICD-10-CM | POA: Insufficient documentation

## 2017-04-02 DIAGNOSIS — Z7951 Long term (current) use of inhaled steroids: Secondary | ICD-10-CM | POA: Insufficient documentation

## 2017-04-02 DIAGNOSIS — E7800 Pure hypercholesterolemia, unspecified: Secondary | ICD-10-CM | POA: Insufficient documentation

## 2017-04-02 DIAGNOSIS — Z9851 Tubal ligation status: Secondary | ICD-10-CM | POA: Diagnosis not present

## 2017-04-02 DIAGNOSIS — Z9889 Other specified postprocedural states: Secondary | ICD-10-CM | POA: Diagnosis not present

## 2017-04-02 DIAGNOSIS — H2511 Age-related nuclear cataract, right eye: Secondary | ICD-10-CM | POA: Insufficient documentation

## 2017-04-02 DIAGNOSIS — Z7982 Long term (current) use of aspirin: Secondary | ICD-10-CM | POA: Insufficient documentation

## 2017-04-02 DIAGNOSIS — M1990 Unspecified osteoarthritis, unspecified site: Secondary | ICD-10-CM | POA: Insufficient documentation

## 2017-04-02 DIAGNOSIS — Z79899 Other long term (current) drug therapy: Secondary | ICD-10-CM | POA: Insufficient documentation

## 2017-04-02 DIAGNOSIS — Z9109 Other allergy status, other than to drugs and biological substances: Secondary | ICD-10-CM | POA: Diagnosis not present

## 2017-04-02 DIAGNOSIS — K219 Gastro-esophageal reflux disease without esophagitis: Secondary | ICD-10-CM | POA: Diagnosis not present

## 2017-04-02 DIAGNOSIS — F419 Anxiety disorder, unspecified: Secondary | ICD-10-CM | POA: Diagnosis not present

## 2017-04-02 HISTORY — PX: CATARACT EXTRACTION W/PHACO: SHX586

## 2017-04-02 SURGERY — PHACOEMULSIFICATION, CATARACT, WITH IOL INSERTION
Anesthesia: Monitor Anesthesia Care | Site: Eye | Laterality: Right | Wound class: Clean

## 2017-04-02 MED ORDER — EPINEPHRINE PF 1 MG/ML IJ SOLN
INTRAMUSCULAR | Status: DC | PRN
  Administered 2017-04-02: 1 mL via OPHTHALMIC

## 2017-04-02 MED ORDER — MOXIFLOXACIN HCL 0.5 % OP SOLN
OPHTHALMIC | Status: AC
  Filled 2017-04-02: qty 3

## 2017-04-02 MED ORDER — ARMC OPHTHALMIC DILATING DROPS
1.0000 "application " | OPHTHALMIC | Status: AC
  Administered 2017-04-02 (×3): 1 via OPHTHALMIC

## 2017-04-02 MED ORDER — SODIUM CHLORIDE 0.9 % IV SOLN
INTRAVENOUS | Status: DC
  Administered 2017-04-02 (×2): via INTRAVENOUS

## 2017-04-02 MED ORDER — POVIDONE-IODINE 5 % OP SOLN
OPHTHALMIC | Status: AC
  Filled 2017-04-02: qty 30

## 2017-04-02 MED ORDER — MIDAZOLAM HCL 2 MG/2ML IJ SOLN
INTRAMUSCULAR | Status: AC
  Filled 2017-04-02: qty 2

## 2017-04-02 MED ORDER — LIDOCAINE HCL (PF) 4 % IJ SOLN
INTRAMUSCULAR | Status: AC
  Filled 2017-04-02: qty 5

## 2017-04-02 MED ORDER — NA CHONDROIT SULF-NA HYALURON 40-17 MG/ML IO SOLN
INTRAOCULAR | Status: AC
  Filled 2017-04-02: qty 1

## 2017-04-02 MED ORDER — MIDAZOLAM HCL 2 MG/2ML IJ SOLN
INTRAMUSCULAR | Status: DC | PRN
  Administered 2017-04-02: 2 mg via INTRAVENOUS

## 2017-04-02 MED ORDER — EPINEPHRINE PF 1 MG/ML IJ SOLN
INTRAMUSCULAR | Status: AC
  Filled 2017-04-02: qty 1

## 2017-04-02 MED ORDER — MOXIFLOXACIN HCL 0.5 % OP SOLN
OPHTHALMIC | Status: DC | PRN
  Administered 2017-04-02: .2 mL via OPHTHALMIC

## 2017-04-02 MED ORDER — LIDOCAINE HCL (PF) 4 % IJ SOLN
INTRAMUSCULAR | Status: DC | PRN
  Administered 2017-04-02: 2 mL via OPHTHALMIC

## 2017-04-02 MED ORDER — ARMC OPHTHALMIC DILATING DROPS
OPHTHALMIC | Status: AC
  Administered 2017-04-02: 1 via OPHTHALMIC
  Filled 2017-04-02: qty 0.4

## 2017-04-02 MED ORDER — CARBACHOL 0.01 % IO SOLN
INTRAOCULAR | Status: DC | PRN
  Administered 2017-04-02: .5 mL via INTRAOCULAR

## 2017-04-02 MED ORDER — POVIDONE-IODINE 5 % OP SOLN
OPHTHALMIC | Status: DC | PRN
  Administered 2017-04-02: 1 via OPHTHALMIC

## 2017-04-02 MED ORDER — NA CHONDROIT SULF-NA HYALURON 40-17 MG/ML IO SOLN
INTRAOCULAR | Status: DC | PRN
  Administered 2017-04-02: 1 mL via INTRAOCULAR

## 2017-04-02 MED ORDER — MOXIFLOXACIN HCL 0.5 % OP SOLN: 1.0000 [drp] | OPHTHALMIC | Status: DC | PRN

## 2017-04-02 SURGICAL SUPPLY — 16 items
GLOVE BIO SURGEON STRL SZ8 (GLOVE) ×3 IMPLANT
GLOVE BIOGEL M 6.5 STRL (GLOVE) ×3 IMPLANT
GLOVE SURG LX 8.0 MICRO (GLOVE) ×2
GLOVE SURG LX STRL 8.0 MICRO (GLOVE) ×1 IMPLANT
GOWN STRL REUS W/ TWL LRG LVL3 (GOWN DISPOSABLE) ×2 IMPLANT
GOWN STRL REUS W/TWL LRG LVL3 (GOWN DISPOSABLE) ×4
LABEL CATARACT MEDS ST (LABEL) ×3 IMPLANT
LENS IOL TECNIS ITEC 19.0 (Intraocular Lens) ×3 IMPLANT
PACK CATARACT (MISCELLANEOUS) ×3 IMPLANT
PACK CATARACT BRASINGTON LX (MISCELLANEOUS) ×3 IMPLANT
PACK EYE AFTER SURG (MISCELLANEOUS) ×3 IMPLANT
SOL BSS BAG (MISCELLANEOUS) ×3
SOLUTION BSS BAG (MISCELLANEOUS) ×1 IMPLANT
SYR 5ML LL (SYRINGE) ×3 IMPLANT
WATER STERILE IRR 250ML POUR (IV SOLUTION) ×3 IMPLANT
WIPE NON LINTING 3.25X3.25 (MISCELLANEOUS) ×3 IMPLANT

## 2017-04-02 NOTE — Anesthesia Procedure Notes (Signed)
Procedure Name: MAC Date/Time: 04/02/2017 8:27 AM Performed by: Nelda Marseille, CRNA Pre-anesthesia Checklist: Patient identified, Emergency Drugs available, Suction available, Patient being monitored and Timeout performed

## 2017-04-02 NOTE — Transfer of Care (Signed)
Immediate Anesthesia Transfer of Care Note  Patient: Sonya Wade  Procedure(s) Performed: CATARACT EXTRACTION PHACO AND INTRAOCULAR LENS PLACEMENT (IOC) (Right Eye)  Patient Location: PACU  Anesthesia Type:MAC  Level of Consciousness: awake, alert  and oriented  Airway & Oxygen Therapy: Patient Spontanous Breathing and Patient connected to nasal cannula oxygen  Post-op Assessment: Report given to RN  Post vital signs: Reviewed and stable  Last Vitals:  Vitals:   04/02/17 0657  BP: 121/69  Pulse: (!) 50  Resp: 20  Temp: 36.6 C  SpO2: 99%    Last Pain:  Vitals:   04/02/17 0657  TempSrc: Oral      Patients Stated Pain Goal: 0 (39/43/20 0379)  Complications: No apparent anesthesia complications

## 2017-04-02 NOTE — Anesthesia Post-op Follow-up Note (Signed)
Anesthesia QCDR form completed.        

## 2017-04-02 NOTE — H&P (Signed)
All labs reviewed. Abnormal studies sent to patients PCP when indicated.  Previous H&P reviewed, patient examined, there are NO CHANGES.  Sonya Wade LOUIS11/14/20188:17 AM

## 2017-04-02 NOTE — Discharge Instructions (Signed)
Eye Surgery Discharge Instructions  Expect mild scratchy sensation or mild soreness. DO NOT RUB YOUR EYE!  The day of surgery:  Minimal physical activity, but bed rest is not required  No reading, computer work, or close hand work  No bending, lifting, or straining.  May watch TV  For 24 hours:  No driving, legal decisions, or alcoholic beverages  Safety precautions  Eat anything you prefer: It is better to start with liquids, then soup then solid foods.  _____ Eye patch should be worn until postoperative exam tomorrow.  ____ Solar shield eyeglasses should be worn for comfort in the sunlight/patch while sleeping  Resume all regular medications including aspirin or Coumadin if these were discontinued prior to surgery. You may shower, bathe, shave, or wash your hair. Tylenol may be taken for mild discomfort.  Call your doctor if you experience significant pain, nausea, or vomiting, fever > 101 or other signs of infection. [phone] or [phone] Specific instructions:  Follow-up Information    Birder Robson, MD Follow up.   Specialty:  Ophthalmology Why:  November 15 at 9:10am Contact information: [address] [place] [address] [phone]

## 2017-04-02 NOTE — Op Note (Signed)
PREOPERATIVE DIAGNOSIS:  Nuclear sclerotic cataract of the right eye.   POSTOPERATIVE DIAGNOSIS:  nuclear sclerotic cataract right eye   OPERATIVE PROCEDURE: Procedure(s): CATARACT EXTRACTION PHACO AND INTRAOCULAR LENS PLACEMENT (IOC)   SURGEON:  Birder Robson, MD.   ANESTHESIA:  Anesthesiologist: Piscitello, Precious Haws, MD CRNA: Nelda Marseille, CRNA  1.      Managed anesthesia care. 2.      0.75ml of Shugarcaine was instilled in the eye following the paracentesis.   COMPLICATIONS:  None.   TECHNIQUE:   Stop and chop   DESCRIPTION OF PROCEDURE:  The patient was examined and consented in the preoperative holding area where the aforementioned topical anesthesia was applied to the right eye and then brought back to the Operating Room where the right eye was prepped and draped in the usual sterile ophthalmic fashion and a lid speculum was placed. A paracentesis was created with the side port blade and the anterior chamber was filled with viscoelastic. A near clear corneal incision was performed with the steel keratome. A continuous curvilinear capsulorrhexis was performed with a cystotome followed by the capsulorrhexis forceps. Hydrodissection and hydrodelineation were carried out with BSS on a blunt cannula. The lens was removed in a stop and chop  technique and the remaining cortical material was removed with the irrigation-aspiration handpiece. The capsular bag was inflated with viscoelastic and the Technis [id number]  lens was placed in the capsular bag without complication. The remaining viscoelastic was removed from the eye with the irrigation-aspiration handpiece. The wounds were hydrated. The anterior chamber was flushed with Miostat and the eye was inflated to physiologic pressure. 0.50ml of Vigamox was placed in the anterior chamber. The wounds were found to be water tight. The eye was dressed with Vigamox. The patient was given protective glasses to wear throughout the day and a shield with which  to sleep tonight. The patient was also given drops with which to begin a drop regimen today and will follow-up with me in one day. Implant Name Type Inv. Item Serial No. Manufacturer Lot No. LRB No. Used  LENS IOL DIOP 19.0 - [id number] Intraocular Lens LENS IOL DIOP 19.0 [id number] AMO  Right 1   Procedure(s) with comments: CATARACT EXTRACTION PHACO AND INTRAOCULAR LENS PLACEMENT (IOC) (Right) - [place] 00:32 AP% 17.2 CDE 5.57 Fluid pack lot # [id number]  Electronically signed: [place] 04/02/2017 8:43 AM

## 2017-04-02 NOTE — Anesthesia Preprocedure Evaluation (Signed)
Anesthesia Evaluation  Patient identified by MRN, date of birth, ID band Patient awake    Reviewed: Allergy & Precautions, H&P , NPO status , Patient's Chart, lab work & pertinent test results  Airway Mallampati: III  TM Distance: >3 FB Neck ROM: full    Dental  (+) Chipped, Poor Dentition, Missing, Edentulous Upper, Upper Dentures   Pulmonary neg pulmonary ROS, neg shortness of breath,           Cardiovascular Exercise Tolerance: Good (-) angina(-) Past MI and (-) DOE negative cardio ROS       Neuro/Psych PSYCHIATRIC DISORDERS Anxiety negative neurological ROS     GI/Hepatic Neg liver ROS, GERD  Medicated and Controlled,  Endo/Other  negative endocrine ROS  Renal/GU      Musculoskeletal   Abdominal   Peds  Hematology negative hematology ROS (+)   Anesthesia Other Findings Past Medical History: No date: Arthritis 2011: Benign neoplasm of breast No date: Diffuse cystic mastopathy No date: Family history of malignant neoplasm of breast No date: Lump or mass in breast  Past Surgical History: 2013: BREAST BIOPSY; Left     Comment:  core, apocine cyst. S marker 02/04/2017: BREAST BIOPSY; Left     Comment:  Dr. Jamal Collin BX of 10:00 mass. R marker Path pending 08/13/2016: BREAST CYST ASPIRATION; Left     Comment:  FNA of 10:00 cyst done by DR. Sankar 2005: BREAST EXCISIONAL BIOPSY; Left     Comment:  intraductal papilloma May 2008: COLONOSCOPY     Comment:  Dr. Jamal Collin 1980: TUBAL LIGATION  BMI    Body Mass Index:  27.98 kg/m      Reproductive/Obstetrics negative OB ROS                             Anesthesia Physical Anesthesia Plan  ASA: III  Anesthesia Plan: MAC   Post-op Pain Management:    Induction: Intravenous  PONV Risk Score and Plan: Midazolam  Airway Management Planned: Natural Airway and Nasal Cannula  Additional Equipment:   Intra-op Plan:    Post-operative Plan:   Informed Consent: I have reviewed the patients History and Physical, chart, labs and discussed the procedure including the risks, benefits and alternatives for the proposed anesthesia with the patient or authorized representative who has indicated his/her understanding and acceptance.   Dental Advisory Given  Plan Discussed with: Anesthesiologist, CRNA and Surgeon  Anesthesia Plan Comments: (Patient consented for risks of anesthesia including but not limited to:  - adverse reactions to medications - damage to teeth, lips or other oral mucosa - sore throat or hoarseness - Damage to heart, brain, lungs or loss of life  Patient voiced understanding.)        Anesthesia Quick Evaluation

## 2017-04-02 NOTE — Anesthesia Postprocedure Evaluation (Signed)
Anesthesia Post Note  Patient: Alishea Beaudin Bost[place]  Procedure(s) Performed: CATARACT EXTRACTION PHACO AND INTRAOCULAR LENS PLACEMENT (IOC) (Right Eye)  Patient location during evaluation: PACU Anesthesia Type: MAC Level of consciousness: awake, awake and alert and oriented Pain management: pain level controlled Respiratory status: spontaneous breathing, nonlabored ventilation and respiratory function stable Cardiovascular status: stable Anesthetic complications: no     Last Vitals:  Vitals:   04/02/17 0657 04/02/17 0845  BP: 121/69 118/73  Pulse: (!) 50 (!) 47  Resp: 20   Temp: 36.6 C 36.4 C  SpO2: 99% 100%    Last Pain:  Vitals:   04/02/17 0657  TempSrc: Oral                 Melayna Robarts,  [facility]

## 2017-04-17 DIAGNOSIS — H2512 Age-related nuclear cataract, left eye: Secondary | ICD-10-CM | POA: Diagnosis not present

## 2017-04-21 ENCOUNTER — Encounter: Payer: Self-pay | Admitting: *Deleted

## 2017-04-22 ENCOUNTER — Ambulatory Visit
Admission: RE | Admit: 2017-04-22 | Discharge: 2017-04-22 | Disposition: A | Discharge status: Home / Self Care | Payer: Medicare Other | Source: Ambulatory Visit | Attending: Ophthalmology | Admitting: Ophthalmology

## 2017-04-22 ENCOUNTER — Ambulatory Visit: Payer: Medicare Other | Admitting: Certified Registered Nurse Anesthetist

## 2017-04-22 ENCOUNTER — Encounter
Admission: RE | Disposition: A | Discharge status: Home / Self Care | Payer: Self-pay | Source: Ambulatory Visit | Attending: Ophthalmology

## 2017-04-22 ENCOUNTER — Encounter: Payer: Self-pay | Admitting: Certified Registered Nurse Anesthetist

## 2017-04-22 DIAGNOSIS — F419 Anxiety disorder, unspecified: Secondary | ICD-10-CM | POA: Insufficient documentation

## 2017-04-22 DIAGNOSIS — M1990 Unspecified osteoarthritis, unspecified site: Secondary | ICD-10-CM | POA: Diagnosis not present

## 2017-04-22 DIAGNOSIS — I10 Essential (primary) hypertension: Secondary | ICD-10-CM | POA: Diagnosis not present

## 2017-04-22 DIAGNOSIS — Z79899 Other long term (current) drug therapy: Secondary | ICD-10-CM | POA: Insufficient documentation

## 2017-04-22 DIAGNOSIS — Z7982 Long term (current) use of aspirin: Secondary | ICD-10-CM | POA: Diagnosis not present

## 2017-04-22 DIAGNOSIS — E7800 Pure hypercholesterolemia, unspecified: Secondary | ICD-10-CM | POA: Insufficient documentation

## 2017-04-22 DIAGNOSIS — H2512 Age-related nuclear cataract, left eye: Secondary | ICD-10-CM | POA: Insufficient documentation

## 2017-04-22 DIAGNOSIS — K219 Gastro-esophageal reflux disease without esophagitis: Secondary | ICD-10-CM | POA: Insufficient documentation

## 2017-04-22 HISTORY — PX: CATARACT EXTRACTION W/PHACO: SHX586

## 2017-04-22 SURGERY — PHACOEMULSIFICATION, CATARACT, WITH IOL INSERTION
Anesthesia: Monitor Anesthesia Care | Site: Eye | Laterality: Left | Wound class: Clean

## 2017-04-22 MED ORDER — SODIUM CHLORIDE 0.9 % IV SOLN
INTRAVENOUS | Status: DC
  Administered 2017-04-22: 07:00:00 via INTRAVENOUS

## 2017-04-22 MED ORDER — NA CHONDROIT SULF-NA HYALURON 40-17 MG/ML IO SOLN
INTRAOCULAR | Status: AC
  Filled 2017-04-22: qty 1

## 2017-04-22 MED ORDER — MIDAZOLAM HCL 2 MG/2ML IJ SOLN
INTRAMUSCULAR | Status: DC | PRN
  Administered 2017-04-22: 1 mg via INTRAVENOUS

## 2017-04-22 MED ORDER — POVIDONE-IODINE 5 % OP SOLN
OPHTHALMIC | Status: DC | PRN
  Administered 2017-04-22: 1 via OPHTHALMIC

## 2017-04-22 MED ORDER — EPINEPHRINE PF 1 MG/ML IJ SOLN
INTRAMUSCULAR | Status: AC
  Filled 2017-04-22: qty 1

## 2017-04-22 MED ORDER — MOXIFLOXACIN HCL 0.5 % OP SOLN
OPHTHALMIC | Status: DC | PRN
  Administered 2017-04-22: .2 mL via OPHTHALMIC

## 2017-04-22 MED ORDER — NA CHONDROIT SULF-NA HYALURON 40-17 MG/ML IO SOLN
INTRAOCULAR | Status: DC | PRN
  Administered 2017-04-22: 1 mL via INTRAOCULAR

## 2017-04-22 MED ORDER — FENTANYL CITRATE (PF) 100 MCG/2ML IJ SOLN
INTRAMUSCULAR | Status: DC | PRN
  Administered 2017-04-22: 25 ug via INTRAVENOUS

## 2017-04-22 MED ORDER — LIDOCAINE HCL (PF) 4 % IJ SOLN
INTRAOCULAR | Status: DC | PRN
  Administered 2017-04-22: 2 mL via OPHTHALMIC

## 2017-04-22 MED ORDER — ARMC OPHTHALMIC DILATING DROPS
1.0000 "application " | OPHTHALMIC | Status: AC
  Administered 2017-04-22 (×3): 1 via OPHTHALMIC

## 2017-04-22 MED ORDER — MOXIFLOXACIN HCL 0.5 % OP SOLN
OPHTHALMIC | Status: AC
  Filled 2017-04-22: qty 3

## 2017-04-22 MED ORDER — ARMC OPHTHALMIC DILATING DROPS
OPHTHALMIC | Status: AC
  Administered 2017-04-22: 1 via OPHTHALMIC
  Filled 2017-04-22: qty 0.4

## 2017-04-22 MED ORDER — MIDAZOLAM HCL 2 MG/2ML IJ SOLN
INTRAMUSCULAR | Status: AC
  Filled 2017-04-22: qty 2

## 2017-04-22 MED ORDER — EPINEPHRINE PF 1 MG/ML IJ SOLN
INTRAMUSCULAR | Status: DC | PRN
  Administered 2017-04-22: 1 mL via OPHTHALMIC

## 2017-04-22 MED ORDER — CARBACHOL 0.01 % IO SOLN
INTRAOCULAR | Status: DC | PRN
  Administered 2017-04-22: .5 mL via INTRAOCULAR

## 2017-04-22 MED ORDER — LIDOCAINE HCL (PF) 4 % IJ SOLN
INTRAMUSCULAR | Status: AC
  Filled 2017-04-22: qty 5

## 2017-04-22 MED ORDER — FENTANYL CITRATE (PF) 100 MCG/2ML IJ SOLN
INTRAMUSCULAR | Status: AC
  Filled 2017-04-22: qty 2

## 2017-04-22 MED ORDER — MOXIFLOXACIN HCL 0.5 % OP SOLN: 1.0000 [drp] | OPHTHALMIC | Status: DC | PRN

## 2017-04-22 MED ORDER — POVIDONE-IODINE 5 % OP SOLN
OPHTHALMIC | Status: AC
  Filled 2017-04-22: qty 30

## 2017-04-22 SURGICAL SUPPLY — 16 items
GLOVE BIO SURGEON STRL SZ8 (GLOVE) ×3 IMPLANT
GLOVE BIOGEL M 6.5 STRL (GLOVE) ×3 IMPLANT
GLOVE SURG LX 8.0 MICRO (GLOVE) ×2
GLOVE SURG LX STRL 8.0 MICRO (GLOVE) ×1 IMPLANT
GOWN STRL REUS W/ TWL LRG LVL3 (GOWN DISPOSABLE) ×2 IMPLANT
GOWN STRL REUS W/TWL LRG LVL3 (GOWN DISPOSABLE) ×4
LABEL CATARACT MEDS ST (LABEL) ×3 IMPLANT
LENS IOL TECNIS ITEC 21.0 (Intraocular Lens) ×3 IMPLANT
PACK CATARACT (MISCELLANEOUS) ×3 IMPLANT
PACK CATARACT BRASINGTON LX (MISCELLANEOUS) ×3 IMPLANT
PACK EYE AFTER SURG (MISCELLANEOUS) ×3 IMPLANT
SOL BSS BAG (MISCELLANEOUS) ×3
SOLUTION BSS BAG (MISCELLANEOUS) ×1 IMPLANT
SYR 5ML LL (SYRINGE) ×3 IMPLANT
WATER STERILE IRR 250ML POUR (IV SOLUTION) ×3 IMPLANT
WIPE NON LINTING 3.25X3.25 (MISCELLANEOUS) ×3 IMPLANT

## 2017-04-22 NOTE — Anesthesia Preprocedure Evaluation (Signed)
Anesthesia Evaluation  Patient identified by MRN, date of birth, ID band Patient awake    Reviewed: Allergy & Precautions, H&P , NPO status , Patient's Chart, lab work & pertinent test results  Airway Mallampati: III  TM Distance: >3 FB Neck ROM: full    Dental  (+) Chipped, Poor Dentition, Missing, Edentulous Upper, Upper Dentures   Pulmonary neg pulmonary ROS, neg shortness of breath,           Cardiovascular Exercise Tolerance: Good hypertension, Pt. on medications (-) angina(-) Past MI and (-) DOE negative cardio ROS       Neuro/Psych PSYCHIATRIC DISORDERS Anxiety negative neurological ROS     GI/Hepatic Neg liver ROS, GERD  Medicated,  Endo/Other  negative endocrine ROS  Renal/GU negative Renal ROS  negative genitourinary   Musculoskeletal  (+) Arthritis , Osteoarthritis,    Abdominal   Peds negative pediatric ROS (+)  Hematology negative hematology ROS (+)   Anesthesia Other Findings Past Medical History: No date: Arthritis 2011: Benign neoplasm of breast No date: Diffuse cystic mastopathy No date: Family history of malignant neoplasm of breast No date: Lump or mass in breast  Past Surgical History: 2013: BREAST BIOPSY; Left     Comment:  core, apocine cyst. S marker 02/04/2017: BREAST BIOPSY; Left     Comment:  Dr. Jamal Collin BX of 10:00 mass. R marker Path pending 08/13/2016: BREAST CYST ASPIRATION; Left     Comment:  FNA of 10:00 cyst done by DR. Sankar 2005: BREAST EXCISIONAL BIOPSY; Left     Comment:  intraductal papilloma May 2008: COLONOSCOPY     Comment:  Dr. Jamal Collin 1980: TUBAL LIGATION  BMI    Body Mass Index:  27.98 kg/m      Reproductive/Obstetrics negative OB ROS                             Anesthesia Physical  Anesthesia Plan  ASA: III  Anesthesia Plan: MAC   Post-op Pain Management:    Induction: Intravenous  PONV Risk Score and Plan:  Midazolam  Airway Management Planned: Natural Airway and Nasal Cannula  Additional Equipment:   Intra-op Plan:   Post-operative Plan:   Informed Consent: I have reviewed the patients History and Physical, chart, labs and discussed the procedure including the risks, benefits and alternatives for the proposed anesthesia with the patient or authorized representative who has indicated his/her understanding and acceptance.   Dental Advisory Given  Plan Discussed with: Anesthesiologist, CRNA and Surgeon  Anesthesia Plan Comments: (Patient consented for risks of anesthesia including but not limited to:  - adverse reactions to medications - damage to teeth, lips or other oral mucosa - sore throat or hoarseness - Damage to heart, brain, lungs or loss of life  Patient voiced understanding.)        Anesthesia Quick Evaluation

## 2017-04-22 NOTE — OR Nursing (Signed)
Patient instructed to call her PCP to report heart rate 46-50.

## 2017-04-22 NOTE — Anesthesia Postprocedure Evaluation (Signed)
Anesthesia Post Note  Patient: Sonya Wade  Procedure(s) Performed: CATARACT EXTRACTION PHACO AND INTRAOCULAR LENS PLACEMENT (IOC) (Left Eye)  Patient location during evaluation: PACU Anesthesia Type: MAC Level of consciousness: awake and alert and oriented Pain management: pain level controlled Vital Signs Assessment: post-procedure vital signs reviewed and stable Respiratory status: spontaneous breathing Cardiovascular status: blood pressure returned to baseline Anesthetic complications: no     Last Vitals:  Vitals:   04/22/17 0823 04/22/17 0833  BP: 123/71 135/68  Pulse: (!) 48 (!) 49  Resp: 18 16  Temp: 36.7 C   SpO2: 100% 100%    Last Pain:  Vitals:   04/22/17 0823  TempSrc: Oral                 Rawn Quiroa

## 2017-04-22 NOTE — Op Note (Signed)
PREOPERATIVE DIAGNOSIS:  Nuclear sclerotic cataract of the left eye.   POSTOPERATIVE DIAGNOSIS:  Nuclear sclerotic cataract of the left eye.   OPERATIVE PROCEDURE: Procedure(s): CATARACT EXTRACTION PHACO AND INTRAOCULAR LENS PLACEMENT (IOC)   SURGEON:  Birder Robson, MD.   ANESTHESIA:  Anesthesiologist: Alvin Critchley, MD CRNA: Johnna Ac[place], CRNA  1.      Managed anesthesia care. 2.     0.50ml of Shugarcaine was instilled following the paracentesis   COMPLICATIONS:  None.   TECHNIQUE:   Stop and chop   DESCRIPTION OF PROCEDURE:  The patient was examined and consented in the preoperative holding area where the aforementioned topical anesthesia was applied to the left eye and then brought back to the Operating Room where the left eye was prepped and draped in the usual sterile ophthalmic fashion and a lid speculum was placed. A paracentesis was created with the side port blade and the anterior chamber was filled with viscoelastic. A near clear corneal incision was performed with the steel keratome. A continuous curvilinear capsulorrhexis was performed with a cystotome followed by the capsulorrhexis forceps. Hydrodissection and hydrodelineation were carried out with BSS on a blunt cannula. The lens was removed in a stop and chop  technique and the remaining cortical material was removed with the irrigation-aspiration handpiece. The capsular bag was inflated with viscoelastic and the Technis ZCB00 lens was placed in the capsular bag without complication. The remaining viscoelastic was removed from the eye with the irrigation-aspiration handpiece. The wounds were hydrated. The anterior chamber was flushed with Miostat and the eye was inflated to physiologic pressure. 0.35ml Vigamox was placed in the anterior chamber. The wounds were found to be water tight. The eye was dressed with Vigamox. The patient was given protective glasses to wear throughout the day and a shield with which to sleep  tonight. The patient was also given drops with which to begin a drop regimen today and will follow-up with me in one day. Implant Name Type Inv. Item Serial No. Manufacturer Lot No. LRB No. Used  LENS IOL DIOP 21.0 - [id number] Intraocular Lens LENS IOL DIOP 21.0 [id number] AMO  Left 1    Procedure(s) with comments: CATARACT EXTRACTION PHACO AND INTRAOCULAR LENS PLACEMENT (IOC) (Left) - [place]  00:36 AP% 13.6 CDE 4.95 Fluid pack lot # [id number]  Electronically signed: [place] 04/22/2017 8:20 AM

## 2017-04-22 NOTE — Discharge Instructions (Signed)
Eye Surgery Discharge Instructions  Expect mild scratchy sensation or mild soreness. DO NOT RUB YOUR EYE!  The day of surgery:  Minimal physical activity, but bed rest is not required  No reading, computer work, or close hand work  No bending, lifting, or straining.  May watch TV  For 24 hours:  No driving, legal decisions, or alcoholic beverages  Safety precautions  Eat anything you prefer: It is better to start with liquids, then soup then solid foods.  _____ Eye patch should be worn until postoperative exam tomorrow.  ____ Solar shield eyeglasses should be worn for comfort in the sunlight/patch while sleeping  Resume all regular medications including aspirin or Coumadin if these were discontinued prior to surgery. You may shower, bathe, shave, or wash your hair. Tylenol may be taken for mild discomfort.  Call your doctor if you experience significant pain, nausea, or vomiting, fever > 101 or other signs of infection. [phone] or [phone] Specific instructions:  Follow-up Information    Birder Robson, MD Follow up on 04/23/2017.   Specialty:  Ophthalmology Why:  10:55 Contact information: [address] [place] [address] [phone]

## 2017-04-22 NOTE — Anesthesia Procedure Notes (Signed)
Procedure Name: MAC Date/Time: 04/22/2017 8:01 AM Performed by: Johnna [place], CRNA Pre-anesthesia Checklist: Patient identified, Emergency Drugs available, Suction available, Patient being monitored and Timeout performed Patient Re-evaluated:Patient Re-evaluated prior to induction Oxygen Delivery Method: Nasal cannula

## 2017-04-22 NOTE — Anesthesia Post-op Follow-up Note (Signed)
Anesthesia QCDR form completed.        

## 2017-04-22 NOTE — H&P (Signed)
All labs reviewed. Abnormal studies sent to patients PCP when indicated.  Previous H&P reviewed, patient examined, there are NO CHANGES.  Adrian Dinovo LOUIS12/4/20187:55 AM

## 2017-04-22 NOTE — Transfer of Care (Signed)
Immediate Anesthesia Transfer of Care Note  Patient: Sonya Wade  Procedure(s) Performed: CATARACT EXTRACTION PHACO AND INTRAOCULAR LENS PLACEMENT (IOC) (Left Eye)  Patient Location: PACU  Anesthesia Type:MAC  Level of Consciousness: awake and alert   Airway & Oxygen Therapy: Patient Spontanous Breathing  Post-op Assessment: Report given to RN and Post -op Vital signs reviewed and stable  Post vital signs: Reviewed and stable  Last Vitals:  Vitals:   04/22/17 0625 04/22/17 0823  BP: 113/70 123/71  Pulse:  (!) 48  Resp:  18  Temp:  36.7 C  SpO2:  100%    Last Pain:  Vitals:   04/22/17 0823  TempSrc: Oral         Complications: No apparent anesthesia complications

## 2017-04-24 ENCOUNTER — Telehealth: Payer: Self-pay

## 2017-04-24 NOTE — Telephone Encounter (Signed)
Pt called because Dr. George Ina at [facility] advised pt her heart rate was low during cataract excision surgeries (see in chart). Looking back, her heart rate has been low in office as well (51 at [facility] In January, 54 at [place] in 2016). Pt just wanted to ask PCP about this, and to make sure she does not need a Holter Monitor. Asymptomatic. Please advise.

## 2017-04-24 NOTE — Telephone Encounter (Signed)
We will check an EKG at next visit to evaluate further and can discuss if further workup is needed.  As she is asymptomatic and this is a longstanding problem, no need for urgent eval.  Sonya Wade, Sonya Bucy, MD, MPH [facility] 04/24/2017 11:49 AM

## 2017-04-24 NOTE — Telephone Encounter (Signed)
Pt advised and agrees with treatment plan. 

## 2017-05-15 ENCOUNTER — Encounter: Payer: Self-pay | Admitting: General Surgery

## 2017-05-15 ENCOUNTER — Ambulatory Visit (INDEPENDENT_AMBULATORY_CARE_PROVIDER_SITE_OTHER): Payer: Medicare Other | Admitting: General Surgery

## 2017-05-15 VITALS — BP 112/62 | HR 64 | Resp 12 | Ht 64.0 in | Wt 170.0 lb

## 2017-05-15 DIAGNOSIS — N6002 Solitary cyst of left breast: Secondary | ICD-10-CM | POA: Diagnosis not present

## 2017-05-15 DIAGNOSIS — Z803 Family history of malignant neoplasm of breast: Secondary | ICD-10-CM | POA: Diagnosis not present

## 2017-05-15 DIAGNOSIS — N6322 Unspecified lump in the left breast, upper inner quadrant: Secondary | ICD-10-CM | POA: Diagnosis not present

## 2017-05-15 NOTE — Patient Instructions (Addendum)
The patient is aware to call back for any questions or concerns.  Patient will be asked to return to the office in March 2019 with Dr Bary Castilla  with a bilateral screening mammogram with left diagnostic.

## 2017-05-15 NOTE — Progress Notes (Signed)
Patient ID: Sonya Wade, female   DOB: 02-09-1946, 71 y.o.   MRN: [id number]  Chief Complaint  Patient presents with  . Follow-up    HPI Sonya Wade is a 71 y.o. female.  Here for follow up left breast cyst/mass. Patient states no change in her left breast and denies breast pain. FH breast cancer.     HPI  Past Medical History:  Diagnosis Date  . Arthritis   . Benign neoplasm of breast 2011  . Diffuse cystic mastopathy   . Family history of malignant neoplasm of breast   . Hypertension   . Lump or mass in breast     Past Surgical History:  Procedure Laterality Date  . BREAST BIOPSY Left 2013   core, apocine cyst. S marker  . BREAST BIOPSY Left 02/04/2017   Dr. Jamal Collin BX of 10:00 mass. R marker. INFLAMED BREAST PARENCHYMA WITH FIBROSIS  . BREAST CYST ASPIRATION Left 08/13/2016   FNA of 10:00 cyst done by DR. Almee Pelphrey  . BREAST EXCISIONAL BIOPSY Left 2005   intraductal papilloma  . CATARACT EXTRACTION W/PHACO Right 04/02/2017   Procedure: CATARACT EXTRACTION PHACO AND INTRAOCULAR LENS PLACEMENT (IOC);  Surgeon: Birder Robson, MD;  Location: [facility] ORS;  Service: Ophthalmology;  Laterality: Right;  [place] 00:32 AP% 17.2 CDE 5.57 Fluid pack lot # [id number]  . CATARACT EXTRACTION W/PHACO Left 04/22/2017   Procedure: CATARACT EXTRACTION PHACO AND INTRAOCULAR LENS PLACEMENT ([place]);  Surgeon: Birder Robson, MD;  Location: [facility] ORS;  Service: Ophthalmology;  Laterality: Left;  [place]  00:36 AP% 13.6 CDE 4.95 Fluid pack lot # [id number]  . COLONOSCOPY  May 2008   Dr. Jamal Collin  . TUBAL LIGATION  1980    Family History  Problem Relation Age of Onset  . Breast cancer Mother 44  . Diabetes Father   . Hypertension Sister        Colon Polyps    Social History Social History   Tobacco Use  . Smoking status: Never Smoker  . Smokeless tobacco: Never Used  Substance Use Topics  . Alcohol use: No  . Drug use: No    No Known Allergies  Current Outpatient  Medications  Medication Sig Dispense Refill  . aspirin 81 MG tablet Take 81 mg by mouth daily.    . Cholecalciferol 1000 UNITS tablet Take 1,000 Units by mouth daily.     . Difluprednate (DUREZOL) 0.05 % EMUL Place 1 drop into the left eye 2 (two) times daily.    Marland Kitchen escitalopram (LEXAPRO) 10 MG tablet Take 10 mg by mouth daily as needed (for aniexty/depression).    . fluticasone (FLONASE) 50 MCG/ACT nasal spray Place 2 sprays into both nostrils daily. (Patient taking differently: Place 2 sprays daily as needed into both nostrils for allergies. ) 48 g 1  . folic acid (FOLVITE) 016 MCG tablet Take 400 mcg daily by mouth.    . Multiple Vitamin (MULTIVITAMIN) tablet Take 1 tablet by mouth daily.    . Omega-3 Fatty Acids (FISH OIL PO) Take 1,400 mg by mouth 2 (two) times daily.     . pravastatin (PRAVACHOL) 20 MG tablet TAKE 1 TABLET (20 MG TOTAL) BY MOUTH DAILY. 90 tablet 0  . ranitidine (ZANTAC) 150 MG tablet Take 1 tablet (150 mg total) by mouth 2 (two) times daily as needed for heartburn. 60 tablet 3   No current facility-administered medications for this visit.     Review of Systems Review of Systems  Constitutional: Negative.  Respiratory: Negative.   Cardiovascular: Negative.     Blood pressure 112/62, pulse 64, resp. rate 12, height 5\' 4"  (1.626 m), weight 170 lb (77.1 kg), SpO2 100 %.  Physical Exam Physical Exam  Constitutional: She is oriented to person, place, and time. She appears well-developed and well-nourished.  Pulmonary/Chest: Right breast exhibits no inverted nipple, no mass, no nipple discharge, no skin change and no tenderness. Left breast exhibits inverted nipple. Left breast exhibits no mass, no nipple discharge, no skin change and no tenderness.  Retracted left nipple, chronic unchanged.  Lymphadenopathy:    She has no axillary adenopathy.  Neurological: She is alert and oriented to person, place, and time.  Skin: Skin is warm and dry.  Psychiatric: Her behavior  is normal.    Data Reviewed Prior notes and imaging   Assessment     S/P excision of intraductal papilloma left breast.  Left breast cyst and mass- aspirated and biopsied- benign finding FH breast cancer    Plan    Patient will be asked to return to the office in March 2019 with Dr Bary Castilla  with a right screening mammogram with left diagnostic.      HPI, Physical Exam, Assessment and Plan have been scribed under the direction and in the presence of Mckinley Jewel, MD Karie Fetch, RN I have completed the exam and reviewed the above documentation for accuracy and completeness.  I agree with the above.  Haematologist has been used and any errors in dictation or transcription are unintentional.  Kirsi Hugh G. Jamal Collin, M.D., F.A.C.S.   Junie Panning G 05/15/2017, 10:01 AM

## 2017-06-17 ENCOUNTER — Encounter: Payer: Self-pay | Admitting: Physician Assistant

## 2017-06-17 ENCOUNTER — Ambulatory Visit (INDEPENDENT_AMBULATORY_CARE_PROVIDER_SITE_OTHER): Payer: Medicare Other | Admitting: Physician Assistant

## 2017-06-17 VITALS — BP 112/68 | HR 56 | Temp 98.5°F | Resp 16 | Wt 166.0 lb

## 2017-06-17 DIAGNOSIS — H938X1 Other specified disorders of right ear: Secondary | ICD-10-CM

## 2017-06-17 MED ORDER — FLUTICASONE PROPIONATE 50 MCG/ACT NA SUSP: 2.0000 | Freq: Every day | NASAL | 6 refills | Status: DC

## 2017-06-17 NOTE — Progress Notes (Signed)
Real  Chief Complaint  Patient presents with  . Sinusitis    Started 2 days ago  . URI    Subjective:    Patient ID: Sonya Wade, female    DOB: 1945/11/20, 72 y.o.   MRN: [id number]  Upper Respiratory Infection: Sonya Wade is a 72 y.o. female with a past medical history significant for allergic rhinitis complaining of symptoms of a URI, possible sinusitis. Symptoms include congestion, cough and plugged sensation in the right ear. Onset of symptoms was 2 days ago, unchanged since that time. She also c/o congestion, nasal congestion, non productive cough, post nasal drip and sinus pressure for the past 2 days .  She is drinking plenty of fluids. Evaluation to date: none. Treatment to date: antihistamines. The treatment has provided no relief.   Review of Systems  Constitutional: Positive for fatigue. Negative for activity change, appetite change, chills, diaphoresis, fever and unexpected weight change.  HENT: Positive for congestion, hearing loss, postnasal drip, rhinorrhea, sinus pressure and sinus pain. Negative for ear discharge, ear pain, sneezing, sore throat, tinnitus, trouble swallowing and voice change.   Eyes: Negative.   Respiratory: Positive for cough. Negative for choking, chest tightness, shortness of breath, wheezing and stridor.   Gastrointestinal: Negative.   Musculoskeletal: Negative for myalgias, neck pain and neck stiffness.  Neurological: Positive for light-headedness and headaches. Negative for dizziness.  Hematological: Negative for adenopathy.       Objective:   BP 112/68 (BP Location: Right Arm, Patient Position: Sitting, Cuff Size: Normal)   Pulse (!) 56   Temp 98.5 F (36.9 C) (Oral)   Resp 16   Wt 166 lb (75.3 kg)   BMI 28.49 kg/m   Patient Active Problem List   Diagnosis Date Noted  . Pre-diabetes 06/03/2016  . Diffuse cystic mastopathy   . Family history of malignant neoplasm  of breast   . Hyperlipemia 11/09/2014  . Anxiety and depression 09/19/2014  . Arthritis of knee, degenerative 09/19/2014  . Esophageal reflux 09/19/2014  . Arthritis, degenerative 11/16/2013  . Family history of breast cancer 07/07/2013  . Fibrocystic breast disease 07/07/2013  . Bloodgood disease 07/07/2013  . Decreased leukocytes 09/15/2009  . Allergic rhinitis 09/06/2009    Outpatient Encounter Medications as of 06/17/2017  Medication Sig Note  . aspirin 81 MG tablet Take 81 mg by mouth daily. 04/17/2017: Stopped for procedure  . Cholecalciferol 1000 UNITS tablet Take 1,000 Units by mouth daily.    . Difluprednate (DUREZOL) 0.05 % EMUL Place 1 drop into the left eye 2 (two) times daily.   Marland Kitchen escitalopram (LEXAPRO) 10 MG tablet Take 10 mg by mouth daily as needed (for aniexty/depression).   . folic acid (FOLVITE) 301 MCG tablet Take 400 mcg daily by mouth.   . Multiple Vitamin (MULTIVITAMIN) tablet Take 1 tablet by mouth daily.   . Omega-3 Fatty Acids (FISH OIL PO) Take 1,400 mg by mouth 2 (two) times daily.    . pravastatin (PRAVACHOL) 20 MG tablet TAKE 1 TABLET (20 MG TOTAL) BY MOUTH DAILY.   . ranitidine (ZANTAC) 150 MG tablet Take 1 tablet (150 mg total) by mouth 2 (two) times daily as needed for heartburn.   . fluticasone (FLONASE) 50 MCG/ACT nasal spray Place 2 sprays into both nostrils daily. (Patient not taking: Reported on 06/17/2017)    No facility-administered encounter medications on file as of 06/17/2017.     No Known Allergies     Physical  Exam  Constitutional: She is oriented to person, place, and time. She appears well-developed and well-nourished.  HENT:  Right Ear: External ear normal.  Left Ear: External ear normal.  Mouth/Throat: Oropharynx is clear and moist. No oropharyngeal exudate.  TMS moderately occluded by wax bilaterally. Right TM opaque but not erythematous. Visible left TM is normal in appearance.  Eyes: Conjunctivae are normal.  Neck: Neck supple.   Cardiovascular: Normal rate and regular rhythm.  Pulmonary/Chest: Effort normal and breath sounds normal.  Lymphadenopathy:    She has no cervical adenopathy.  Neurological: She is alert and oriented to person, place, and time.  Skin: Skin is warm and dry.  Psychiatric: She has a normal mood and affect. Her behavior is normal.       Assessment & Plan:  1. Sensation of fullness in right ear  May use flonase to better control allergies, may also use Debrox to soften wax. Please do not use Q-tips.  - fluticasone (FLONASE) 50 MCG/ACT nasal spray; Place 2 sprays into both nostrils daily.  Dispense: 16 g; Refill: 6  Return if symptoms worsen or fail to improve.  The entirety of the information documented in the History of Present Illness, Review of Systems and Physical Exam were personally obtained by me. Portions of this information were initially documented by Ashley Royalty, CMA and reviewed by me for thoroughness and accuracy.

## 2017-06-17 NOTE — Patient Instructions (Signed)
Debrox Ear drops to keep wax soft. Please do not use Q-tips.   Earache, Adult An earache, or ear pain, can be caused by many things, including:  An infection.  Ear wax buildup.  Ear pressure.  Something in the ear that should not be there (foreign body).  A sore throat.  Tooth problems.  Jaw problems.  Treatment of the earache will depend on the cause. If the cause is not clear or cannot be determined, you may need to watch your symptoms until your earache goes away or until a cause is found. Follow these instructions at home: Pay attention to any changes in your symptoms. Take these actions to help with your pain:  Take or apply over-the-counter and prescription medicines only as told by your health care provider.  If you were prescribed an antibiotic medicine, use it as told by your health care provider. Do not stop using the antibiotic even if you start to feel better.  Do not put anything in your ear other than medicine that is prescribed by your health care provider.  If directed, apply heat to the affected area as often as told by your health care provider. Use the heat source that your health care provider recommends, such as a moist heat pack or a heating pad. ? Place a towel between your skin and the heat source. ? Leave the heat on for 20-30 minutes. ? Remove the heat if your skin turns bright red. This is especially important if you are unable to feel pain, heat, or cold. You may have a greater risk of getting burned.  If directed, put ice on the ear: ? Put ice in a plastic bag. ? Place a towel between your skin and the bag. ? Leave the ice on for 20 minutes, 2-3 times a day.  Try resting in an upright position instead of lying down. This may help to reduce pressure in your ear and relieve pain.  Chew gum if it helps to relieve your ear pain.  Treat any allergies as told by your health care provider.  Keep all follow-up visits as told by your health care  provider. This is important.  Contact a health care provider if:  Your pain does not improve within 2 days.  Your earache gets worse.  You have new symptoms.  You have a fever. Get help right away if:  You have a severe headache.  You have a stiff neck.  You have trouble swallowing.  You have redness or swelling behind your ear.  You have fluid or blood coming from your ear.  You have hearing loss.  You feel dizzy. This information is not intended to replace advice given to you by your health care provider. Make sure you discuss any questions you have with your health care provider. Document Released: 12/22/2003 Document Revised: 01/02/2016 Document Reviewed: 10/30/2015 Elsevier Interactive Patient Education  [employer].

## 2017-06-18 ENCOUNTER — Ambulatory Visit: Payer: Medicare Other | Admitting: Family Medicine

## 2017-07-16 ENCOUNTER — Encounter: Payer: Self-pay | Admitting: Family Medicine

## 2017-07-16 ENCOUNTER — Ambulatory Visit (INDEPENDENT_AMBULATORY_CARE_PROVIDER_SITE_OTHER): Payer: Medicare Other | Admitting: Family Medicine

## 2017-07-16 VITALS — BP 100/60 | HR 56 | Temp 98.3°F | Resp 16 | Wt 170.0 lb

## 2017-07-16 DIAGNOSIS — R350 Frequency of micturition: Secondary | ICD-10-CM | POA: Diagnosis not present

## 2017-07-16 DIAGNOSIS — M533 Sacrococcygeal disorders, not elsewhere classified: Secondary | ICD-10-CM | POA: Diagnosis not present

## 2017-07-16 DIAGNOSIS — M7551 Bursitis of right shoulder: Secondary | ICD-10-CM | POA: Diagnosis not present

## 2017-07-16 LAB — POCT URINALYSIS DIPSTICK
Bilirubin, UA: NEGATIVE
Glucose, UA: NEGATIVE
Ketones, UA: NEGATIVE
Leukocytes, UA: NEGATIVE
NITRITE UA: NEGATIVE
Protein, UA: NEGATIVE
Spec Grav, UA: 1.02 (ref 1.010–1.025)
Urobilinogen, UA: 0.2 E.U./dL
pH, UA: 6 (ref 5.0–8.0)

## 2017-07-16 MED ORDER — MELOXICAM 15 MG PO TABS
15.0000 mg | ORAL_TABLET | Freq: Every day | ORAL | 1 refills | Status: DC
Start: 2017-07-16 — End: 2018-03-27

## 2017-07-16 NOTE — Progress Notes (Signed)
Patient: Sonya Wade Female    DOB: Jun 02, 1945   72 y.o.   MRN: [id number] Visit Date: 07/16/2017  Today's Provider: Lavon Paganini, MD    Chief Complaint  Patient presents with  . Back Pain   Subjective:    HPI Patient here today C/O left lower back pain x's one week. Patient reports frequent urination x's one week patient denies any pain or burning with urination. Patient reports taking Meloxicam and reports good pain control.   Feels pressure then only urinates a little. Nocturia 2-3 times each night which is unusual for the patient.  She drinks "a lot" of caffeine and is trying to drink more water recently which seems to exacerbate the problem.    Back pain over sacrum mor to L side.  Better with Mobic. Worse with sitting for long periods of time. No injury recently. Had similar last year after getting hit in butt with heavy door. No weakness or numbness, no saddle anesthesia.  R shoulder pain: Has had R shoulder pain over deltoid for ~3 months since having Shingles vaccine at  [facility] in November.  Hurts to raise arm overhead. Also unable to internally rotate to fasten bra.      No Known Allergies   Current Outpatient Medications:  .  aspirin 81 MG tablet, Take 81 mg by mouth daily., Disp: , Rfl:  .  Cholecalciferol 1000 UNITS tablet, Take 1,000 Units by mouth daily. , Disp: , Rfl:  .  Difluprednate (DUREZOL) 0.05 % EMUL, Place 1 drop into the left eye 2 (two) times daily., Disp: , Rfl:  .  escitalopram (LEXAPRO) 10 MG tablet, Take 10 mg by mouth daily as needed (for aniexty/depression)., Disp: , Rfl:  .  fluticasone (FLONASE) 50 MCG/ACT nasal spray, Place 2 sprays into both nostrils daily., Disp: 16 g, Rfl: 6 .  folic acid (FOLVITE) 789 MCG tablet, Take 400 mcg daily by mouth., Disp: , Rfl:  .  Multiple Vitamin (MULTIVITAMIN) tablet, Take 1 tablet by mouth daily., Disp: , Rfl:  .  Omega-3 Fatty Acids (FISH OIL PO), Take 1,400 mg by mouth 2 (two) times  daily. , Disp: , Rfl:  .  pravastatin (PRAVACHOL) 20 MG tablet, TAKE 1 TABLET (20 MG TOTAL) BY MOUTH DAILY., Disp: 90 tablet, Rfl: 0 .  ranitidine (ZANTAC) 150 MG tablet, Take 1 tablet (150 mg total) by mouth 2 (two) times daily as needed for heartburn., Disp: 60 tablet, Rfl: 3  Review of Systems  Constitutional: Negative.   HENT: Positive for congestion, postnasal drip, rhinorrhea and sinus pressure.   Respiratory: Positive for cough. Negative for chest tightness, shortness of breath and wheezing.   Cardiovascular: Negative.   Gastrointestinal: Negative.   Genitourinary: Positive for frequency. Negative for difficulty urinating, dysuria, flank pain, hematuria, pelvic pain, urgency, vaginal bleeding, vaginal discharge and vaginal pain.  Musculoskeletal: Positive for back pain. Negative for gait problem and joint swelling.  Skin: Negative.   Allergic/Immunologic: Positive for environmental allergies.  Neurological: Negative.     Social History   Tobacco Use  . Smoking status: Never Smoker  . Smokeless tobacco: Never Used  Substance Use Topics  . Alcohol use: No   Objective:   BP 100/60 (BP Location: Left Arm, Patient Position: Sitting, Cuff Size: Normal)   Pulse (!) 56   Temp 98.3 F (36.8 C) (Oral)   Resp 16   Wt 170 lb (77.1 kg)   SpO2 99%   BMI 29.18 kg/m  Vitals:   07/16/17 0837  BP: 100/60  Pulse: (!) 56  Resp: 16  Temp: 98.3 F (36.8 C)  TempSrc: Oral  SpO2: 99%  Weight: 170 lb (77.1 kg)     Physical Exam  Constitutional: She is oriented to person, place, and time. She appears well-developed and well-nourished. No distress.  HENT:  Head: Normocephalic and atraumatic.  Eyes: Conjunctivae are normal. No scleral icterus.  Cardiovascular: Normal rate, regular rhythm, normal heart sounds and intact distal pulses.  No murmur heard. Pulmonary/Chest: Effort normal and breath sounds normal. No respiratory distress. She has no wheezes. She has no rales.  Abdominal:  Soft. She exhibits no distension. There is no tenderness.  Musculoskeletal: She exhibits no edema.  Back: No midline TTP over spinous processes. TTP over L sacrum. Negative SLR b/l. Strength and sensation to light touch intact in b/l LEs. R Shoulder: Inspection reveals no abnormalities, atrophy or asymmetry. Palpation is normal with no tenderness over AC joint or bicipital groove. ROM is limited in abduction and flexion and internal rotation due to pain Rotator cuff strength normal throughout. No signs of impingement with negative Neer and Hawkin's tests, empty can. Speeds and Yergason's tests normal.   Neurological: She is alert and oriented to person, place, and time.  Skin: Skin is warm and dry. No rash noted.  Psychiatric: She has a normal mood and affect. Her behavior is normal.  Vitals reviewed.   Results for orders placed or performed in visit on 07/16/17  POCT urinalysis dipstick  Result Value Ref Range   Color, UA yellow    Clarity, UA clear    Glucose, UA negative    Bilirubin, UA negative    Ketones, UA negative    Spec Grav, UA 1.020 1.010 - 1.025   Blood, UA small    pH, UA 6.0 5.0 - 8.0   Protein, UA negative    Urobilinogen, UA 0.2 0.2 or 1.0 E.U./dL   Nitrite, UA negative    Leukocytes, UA Negative Negative       Assessment & Plan:     1. Frequency of urination - UA without signs of infection - will send culture and micro as dipstick shows trace RBCs - advised decreasing caffeine intake as this can act as diuretic - return precautions discussed - POCT urinalysis dipstick - Urine Culture - Urinalysis, microscopic only  2. Bursitis of right shoulder - Seems to have a vaccine-related shoulder injury - offered Ortho referral and PT - patient declined - will try HEP - return precautions discussed  3. Sacral pain - MSK pain over sacrum, especially after sitting for long periods of time - no radicular symptoms or other red flags - advised ice, continue  Mobic, stay active, use seat cushion to off-load pressure - return precautions discussed    Meds ordered this encounter  Medications  . meloxicam (MOBIC) 15 MG tablet    Sig: Take 1 tablet (15 mg total) by mouth daily.    Dispense:  30 tablet    Refill:  1     Return in about 3 months (around 10/13/2017) for physical.   The entirety of the information documented in the History of Present Illness, Review of Systems and Physical Exam were personally obtained by me. Portions of this information were initially documented by Lynford Humphrey, CMA and reviewed by me for thoroughness and accuracy.    Virginia Crews, MD, MPH [facility] 07/16/2017 9:41 AM

## 2017-07-16 NOTE — Patient Instructions (Signed)

## 2017-07-17 LAB — URINALYSIS, MICROSCOPIC ONLY
Bacteria, UA: NONE SEEN
CASTS: NONE SEEN /LPF

## 2017-07-18 LAB — URINE CULTURE

## 2017-07-21 ENCOUNTER — Other Ambulatory Visit: Payer: Self-pay

## 2017-07-21 DIAGNOSIS — Z1231 Encounter for screening mammogram for malignant neoplasm of breast: Secondary | ICD-10-CM

## 2017-07-31 ENCOUNTER — Ambulatory Visit (INDEPENDENT_AMBULATORY_CARE_PROVIDER_SITE_OTHER): Payer: Medicare Other | Admitting: Family Medicine

## 2017-07-31 ENCOUNTER — Encounter: Payer: Self-pay | Admitting: Family Medicine

## 2017-07-31 VITALS — BP 116/78 | HR 75 | Temp 98.3°F | Resp 16 | Wt 164.0 lb

## 2017-07-31 DIAGNOSIS — F419 Anxiety disorder, unspecified: Secondary | ICD-10-CM

## 2017-07-31 DIAGNOSIS — J069 Acute upper respiratory infection, unspecified: Secondary | ICD-10-CM | POA: Diagnosis not present

## 2017-07-31 DIAGNOSIS — F329 Major depressive disorder, single episode, unspecified: Secondary | ICD-10-CM

## 2017-07-31 DIAGNOSIS — M7130 Other bursal cyst, unspecified site: Secondary | ICD-10-CM | POA: Diagnosis not present

## 2017-07-31 DIAGNOSIS — H6983 Other specified disorders of Eustachian tube, bilateral: Secondary | ICD-10-CM | POA: Diagnosis not present

## 2017-07-31 LAB — POCT INFLUENZA A/B
INFLUENZA B, POC: NEGATIVE
Influenza A, POC: NEGATIVE

## 2017-07-31 MED ORDER — LORAZEPAM 0.5 MG PO TABS: 0.2500 mg | ORAL_TABLET | Freq: Two times a day (BID) | ORAL | 0 refills | Status: DC | PRN

## 2017-07-31 NOTE — Patient Instructions (Signed)

## 2017-07-31 NOTE — Progress Notes (Signed)
Patient: Sonya Wade Female    DOB: [birth year]   72 y.o.   MRN: [id number] Visit Date: 07/31/2017  Today's Provider: Lavon Paganini, MD   I, Martha Clan, CMA, am acting as scribe for Lavon Paganini, MD.  Chief Complaint  Patient presents with  . Generalized Body Aches   Subjective:    HPI   Pt is c/o body aches, chills, sweats and some cough for 2 days.Pt is also c/o headache. She states she had a flu vaccine. She had tried Tylenol, with some relief.  Doesn't usually get headaches. Has b/l frontal headaches.  +sinus congestion and ear fullness. No numbness, weakness, dizziness, speech changes, vision changes.  Temperature was 98 when checked last night.  States she has had thumb IP joint pain since working with repetitive motions.  She will occasionally have cyst formation adjacent to the joint.  States she is feeling more anxious than usual with current illness. She is requesting to resume Lorazepam low dose very sparingly.  She is taking Lexapro regularly.  No Known Allergies   Current Outpatient Medications:  .  aspirin 81 MG tablet, Take 81 mg by mouth daily., Disp: , Rfl:  .  Cholecalciferol 1000 UNITS tablet, Take 1,000 Units by mouth daily. , Disp: , Rfl:  .  Difluprednate (DUREZOL) 0.05 % EMUL, Place 1 drop into the left eye 2 (two) times daily., Disp: , Rfl:  .  escitalopram (LEXAPRO) 10 MG tablet, Take 10 mg by mouth daily as needed (for aniexty/depression)., Disp: , Rfl:  .  fluticasone (FLONASE) 50 MCG/ACT nasal spray, Place 2 sprays into both nostrils daily., Disp: 16 g, Rfl: 6 .  folic acid (FOLVITE) 017 MCG tablet, Take 400 mcg daily by mouth., Disp: , Rfl:  .  meloxicam (MOBIC) 15 MG tablet, Take 1 tablet (15 mg total) by mouth daily., Disp: 30 tablet, Rfl: 1 .  Multiple Vitamin (MULTIVITAMIN) tablet, Take 1 tablet by mouth daily., Disp: , Rfl:  .  Omega-3 Fatty Acids (FISH OIL PO), Take 1,400 mg by mouth 2 (two) times daily. , Disp:  , Rfl:  .  pravastatin (PRAVACHOL) 20 MG tablet, TAKE 1 TABLET (20 MG TOTAL) BY MOUTH DAILY., Disp: 90 tablet, Rfl: 0 .  LORazepam (ATIVAN) 0.5 MG tablet, Take 0.5 tablets (0.25 mg total) by mouth 2 (two) times daily as needed for anxiety., Disp: 10 tablet, Rfl: 0  Review of Systems  Constitutional: Positive for activity change, appetite change, chills and diaphoresis. Negative for fatigue, fever and unexpected weight change.  Respiratory: Positive for cough, shortness of breath and wheezing.   Cardiovascular: Negative for chest pain.  Musculoskeletal: Positive for myalgias.  Neurological: Positive for headaches.    Social History   Tobacco Use  . Smoking status: Never Smoker  . Smokeless tobacco: Never Used  Substance Use Topics  . Alcohol use: No   Objective:   BP 116/78 (BP Location: Left Arm, Patient Position: Sitting, Cuff Size: Normal)   Pulse 75   Temp 98.3 F (36.8 C) (Oral)   Resp 16   Wt 164 lb (74.4 kg)   SpO2 95%   BMI 28.15 kg/m  Vitals:   07/31/17 0947  BP: 116/78  Pulse: 75  Resp: 16  Temp: 98.3 F (36.8 C)  TempSrc: Oral  SpO2: 95%  Weight: 164 lb (74.4 kg)     Physical Exam  Constitutional: She is oriented to person, place, and time. She appears well-developed and well-nourished. No  distress.  HENT:  Head: Normocephalic and atraumatic.  Right Ear: Tympanic membrane, external ear and ear canal normal.  Left Ear: Tympanic membrane, external ear and ear canal normal.  Nose: Mucosal edema present. Right sinus exhibits frontal sinus tenderness. Right sinus exhibits no maxillary sinus tenderness. Left sinus exhibits maxillary sinus tenderness and frontal sinus tenderness.  Mouth/Throat: Uvula is midline, oropharynx is clear and moist and mucous membranes are normal.  Eyes: Conjunctivae and EOM are normal. Pupils are equal, round, and reactive to light. Right eye exhibits no discharge. Left eye exhibits no discharge. No scleral icterus.  Neck: Neck  supple. No thyromegaly present.  Cardiovascular: Normal rate, regular rhythm, normal heart sounds and intact distal pulses.  No murmur heard. Pulmonary/Chest: Effort normal and breath sounds normal. No respiratory distress. She has no wheezes. She has no rales.  Musculoskeletal: She exhibits no edema.  R thumb with swelling of IP joint with small cyst adjacent  Lymphadenopathy:    She has no cervical adenopathy.  Neurological: She is alert and oriented to person, place, and time.  Skin: Skin is warm and dry. No rash noted.  Psychiatric: She has a normal mood and affect. Her behavior is normal.  Vitals reviewed.   Results for orders placed or performed in visit on 07/31/17  POCT Influenza A/B  Result Value Ref Range   Influenza A, POC Negative Negative   Influenza B, POC Negative Negative       Assessment & Plan:   1. Viral URI - flu negative, suspect viral URI with sinus pressure/congestion/headache, body aches, etc - no evidence of strep pharyngitis, CAP, AOM, bacterial sinusitis, or other bacterial infection - discussed symptomatic management, natural course, and return precautions  2. Dysfunction of both eustachian tubes - continue flonase, try decongestant - likely related to viral URI and sinus congestion  3. Anxiety and depression - worsening in setting of illness - very small supply of low dose Ativan given - discussed risks with patient - discussed that this is not a long-term medication  4. Synovial cyst - suspect cyst near R thumb IP joint is synovial - no signs of infection - Ambulatory referral to Hand Surgery    Meds ordered this encounter  Medications  . LORazepam (ATIVAN) 0.5 MG tablet    Sig: Take 0.5 tablets (0.25 mg total) by mouth 2 (two) times daily as needed for anxiety.    Dispense:  10 tablet    Refill:  0     Return if symptoms worsen or fail to improve.   The entirety of the information documented in the History of Present Illness,  Review of Systems and Physical Exam were personally obtained by me. Portions of this information were initially documented by Raquel Sarna Ratchford, CMA and reviewed by me for thoroughness and accuracy.    Virginia Crews, MD, MPH [facility] 07/31/2017 11:08 AM

## 2017-08-21 ENCOUNTER — Ambulatory Visit: Payer: Medicare Other | Admitting: General Surgery

## 2017-09-02 ENCOUNTER — Other Ambulatory Visit: Payer: Self-pay | Admitting: General Surgery

## 2017-09-02 ENCOUNTER — Ambulatory Visit
Admission: RE | Admit: 2017-09-02 | Discharge: 2017-09-02 | Disposition: A | Discharge status: Home / Self Care | Payer: Medicare Other | Source: Ambulatory Visit | Attending: General Surgery | Admitting: General Surgery

## 2017-09-02 DIAGNOSIS — Z1231 Encounter for screening mammogram for malignant neoplasm of breast: Secondary | ICD-10-CM

## 2017-09-03 ENCOUNTER — Other Ambulatory Visit: Payer: Self-pay | Admitting: General Surgery

## 2017-09-03 DIAGNOSIS — R928 Other abnormal and inconclusive findings on diagnostic imaging of breast: Secondary | ICD-10-CM

## 2017-09-03 DIAGNOSIS — N6489 Other specified disorders of breast: Secondary | ICD-10-CM

## 2017-09-09 ENCOUNTER — Ambulatory Visit
Admission: RE | Admit: 2017-09-09 | Discharge: 2017-09-09 | Disposition: A | Discharge status: Home / Self Care | Payer: Medicare Other | Source: Ambulatory Visit | Attending: General Surgery | Admitting: General Surgery

## 2017-09-09 DIAGNOSIS — R928 Other abnormal and inconclusive findings on diagnostic imaging of breast: Secondary | ICD-10-CM | POA: Diagnosis not present

## 2017-09-09 DIAGNOSIS — R922 Inconclusive mammogram: Secondary | ICD-10-CM | POA: Diagnosis not present

## 2017-09-09 DIAGNOSIS — N6489 Other specified disorders of breast: Secondary | ICD-10-CM

## 2017-09-09 DIAGNOSIS — N6310 Unspecified lump in the right breast, unspecified quadrant: Secondary | ICD-10-CM | POA: Diagnosis not present

## 2017-09-11 ENCOUNTER — Ambulatory Visit (INDEPENDENT_AMBULATORY_CARE_PROVIDER_SITE_OTHER): Payer: Medicare Other | Admitting: General Surgery

## 2017-09-11 ENCOUNTER — Encounter: Payer: Self-pay | Admitting: General Surgery

## 2017-09-11 VITALS — BP 130/78 | HR 77 | Resp 14 | Ht 64.0 in | Wt 167.0 lb

## 2017-09-11 DIAGNOSIS — Z803 Family history of malignant neoplasm of breast: Secondary | ICD-10-CM | POA: Diagnosis not present

## 2017-09-11 DIAGNOSIS — N6322 Unspecified lump in the left breast, upper inner quadrant: Secondary | ICD-10-CM

## 2017-09-11 NOTE — Patient Instructions (Signed)
The patient has been asked to return to the office in one year with a bilateral diagnostic mammogram. Patient would like to do color guard.

## 2017-09-11 NOTE — Progress Notes (Signed)
Patient ID: Sonya Wade, female   DOB: 1946-01-01, 72 y.o.   MRN: [id number]  Chief Complaint  Patient presents with  . Follow-up    HPI Sonya Wade is a 72 y.o. female who presents for a breast evaluation. The most recent mammogram was done on 09/02/2017 and added views 09/09/2017. Patient does perform regular self breast checks and gets regular mammograms done. Patient has a cyst on her right thumb. The area comes and goes.   HPI  Past Medical History:  Diagnosis Date  . Arthritis   . Benign neoplasm of breast 2011  . Diffuse cystic mastopathy   . Family history of malignant neoplasm of breast   . Hypertension   . Lump or mass in breast     Past Surgical History:  Procedure Laterality Date  . BREAST BIOPSY Left 2013   core, apocine cyst. S marker  . BREAST BIOPSY Left 02/04/2017   Dr. Jamal Collin BX of 10:00 mass. R marker. INFLAMED BREAST PARENCHYMA WITH FIBROSIS  . BREAST CYST ASPIRATION Left 08/13/2016   FNA of 10:00 cyst done by DR. Sankar  . BREAST EXCISIONAL BIOPSY Left 2005   intraductal papilloma  . CATARACT EXTRACTION W/PHACO Right 04/02/2017   Procedure: CATARACT EXTRACTION PHACO AND INTRAOCULAR LENS PLACEMENT (IOC);  Surgeon: Birder Robson, MD;  Location: [facility] ORS;  Service: Ophthalmology;  Laterality: Right;  [place] 00:32 AP% 17.2 CDE 5.57 Fluid pack lot # [id number]  . CATARACT EXTRACTION W/PHACO Left 04/22/2017   Procedure: CATARACT EXTRACTION PHACO AND INTRAOCULAR LENS PLACEMENT ([place]);  Surgeon: Birder Robson, MD;  Location: [facility] ORS;  Service: Ophthalmology;  Laterality: Left;  [place]  00:36 AP% 13.6 CDE 4.95 Fluid pack lot # [id number]  . COLONOSCOPY  May 2008   Dr. Jamal Collin  . TUBAL LIGATION  1980    Family History  Problem Relation Age of Onset  . Breast cancer Mother 70  . Diabetes Father   . Hypertension Sister        Colon Polyps    Social History Social History   Tobacco Use  . Smoking status: Never Smoker  . Smokeless  tobacco: Never Used  Substance Use Topics  . Alcohol use: No  . Drug use: No    No Known Allergies  Current Outpatient Medications  Medication Sig Dispense Refill  . aspirin 81 MG tablet Take 81 mg by mouth daily.    . Cholecalciferol 1000 UNITS tablet Take 1,000 Units by mouth daily.     . Difluprednate (DUREZOL) 0.05 % EMUL Place 1 drop into the left eye 2 (two) times daily.    Marland Kitchen escitalopram (LEXAPRO) 10 MG tablet Take 10 mg by mouth daily as needed (for aniexty/depression).    . fluticasone (FLONASE) 50 MCG/ACT nasal spray Place 2 sprays into both nostrils daily. 16 g 6  . folic acid (FOLVITE) 643 MCG tablet Take 400 mcg daily by mouth.    Marland Kitchen LORazepam (ATIVAN) 0.5 MG tablet Take 0.5 tablets (0.25 mg total) by mouth 2 (two) times daily as needed for anxiety. 10 tablet 0  . meloxicam (MOBIC) 15 MG tablet Take 1 tablet (15 mg total) by mouth daily. 30 tablet 1  . Multiple Vitamin (MULTIVITAMIN) tablet Take 1 tablet by mouth daily.    . Omega-3 Fatty Acids (FISH OIL PO) Take 1,400 mg by mouth 2 (two) times daily.     . pravastatin (PRAVACHOL) 20 MG tablet TAKE 1 TABLET (20 MG TOTAL) BY MOUTH DAILY. 90 tablet 0  No current facility-administered medications for this visit.     Review of Systems Review of Systems  Constitutional: Negative.   Respiratory: Negative.   Cardiovascular: Negative.     Blood pressure 130/78, pulse 77, resp. rate 14, height 5\' 4"  (1.626 m), weight 167 lb (75.8 kg).  Physical Exam Physical Exam  Constitutional: She is oriented to person, place, and time. She appears well-developed and well-nourished.  Eyes: Conjunctivae are normal. No scleral icterus.  Neck: Neck supple.  Cardiovascular: Normal rate, regular rhythm and normal heart sounds.  Pulmonary/Chest: Effort normal and breath sounds normal. Right breast exhibits no inverted nipple, no mass, no nipple discharge, no skin change and no tenderness. Left breast exhibits inverted nipple. Left breast  exhibits no mass, no nipple discharge, no skin change and no tenderness.  Lymphadenopathy:    She has no cervical adenopathy.    She has no axillary adenopathy.  Neurological: She is alert and oriented to person, place, and time.  Skin: Skin is dry.    Data Reviewed 2018 screening mammograms and 2019 diagnostic mammograms reviewed.  Questionable interval change, BI-RADS-3. Ultrasound exam: Targeted ultrasound is performed, showing right breast 9 o'clock, retroareolar location, hypoechoic circumscribed nodule which measures 0.5 x 0.5 x 0.6 cm. This is an incidental finding and likely corresponds to the mammographically seen stable on multiple prior mammograms few mm circumscribed nodule seen in the immediate retroareolar right breast.   Assessment    Unremarkable breast exam, multiple previous breast biopsies, likely incidental finding on recent imaging.    Plan  Options for management were reviewed.  Patient is adamant about not wanting a biopsy.  She has been frustrated by the multiple mammographic studies, the first screening study being covered by insurance, additional studies going towards her deductible and presenting a significant financial hardship.  Considering the lack of interval change over the past year the opportunity of 6 versus 35-month follow-up was discussed.  Patient is leaning towards the latter which I support.  We discussed follow-up colonoscopy.  She was interested in completing a Cologuard test.  Difference between colonoscopy and Cologuard reviewed.  Need for colonoscopy if Cologuard positive discussed.   The patient has been asked to return to the office in one year with a bilateral diagnostic mammogram.    HPI, Physical Exam, Assessment and Plan have been scribed under the direction and in the presence of Hervey Ard, MD.  Gaspar Cola, CMA  I have completed the exam and reviewed the above documentation for accuracy and completeness.  I agree with  the above.  Haematologist has been used and any errors in dictation or transcription are unintentional.  Hervey Ard, M.D., F.A.C.S.     Sonya Wade 09/11/2017, 1:23 PM

## 2017-09-23 DIAGNOSIS — Z1212 Encounter for screening for malignant neoplasm of rectum: Secondary | ICD-10-CM | POA: Diagnosis not present

## 2017-09-23 DIAGNOSIS — Z1211 Encounter for screening for malignant neoplasm of colon: Secondary | ICD-10-CM | POA: Diagnosis not present

## 2017-09-24 ENCOUNTER — Other Ambulatory Visit: Payer: Self-pay | Admitting: Family Medicine

## 2017-09-24 ENCOUNTER — Ambulatory Visit (INDEPENDENT_AMBULATORY_CARE_PROVIDER_SITE_OTHER): Payer: Medicare Other

## 2017-09-24 VITALS — BP 112/72 | HR 60 | Temp 98.4°F | Ht 64.0 in | Wt 169.6 lb

## 2017-09-24 DIAGNOSIS — Z0000 Encounter for general adult medical examination without abnormal findings: Secondary | ICD-10-CM | POA: Diagnosis not present

## 2017-09-24 DIAGNOSIS — K219 Gastro-esophageal reflux disease without esophagitis: Secondary | ICD-10-CM

## 2017-09-24 NOTE — Progress Notes (Signed)
Subjective:   Sonya Wade is a 72 y.o. female who presents for Medicare Annual (Subsequent) preventive examination.  Review of Systems:  N/A  Cardiac Risk Factors include: advanced age (>34mn, >>73women);dyslipidemia     Objective:     Vitals: BP 112/72 (BP Location: Right Arm)   Pulse 60   Temp 98.4 F (36.9 C) (Oral)   Ht '5\' 4"'  (1.626 m)   Wt 169 lb 9.6 oz (76.9 kg)   BMI 29.11 kg/m   Body mass index is 29.11 kg/m.  Advanced Directives 09/24/2017 04/22/2017 04/02/2017 11/04/2014  Does Patient Have a Medical Advance Directive? No No No No  Would patient like information on creating a medical advance directive? No - Patient declined No - Patient declined No - Patient declined No - patient declined information    Tobacco Social History   Tobacco Use  Smoking Status Never Smoker  Smokeless Tobacco Never Used     Counseling given: Not Answered   Clinical Intake:  Pre-visit preparation completed: Yes  Pain : No/denies pain Pain Score: 0-No pain     Nutritional Status: BMI 25 -29 Overweight Nutritional Risks: None Diabetes: No  How often do you need to have someone help you when you read instructions, pamphlets, or other written materials from your doctor or pharmacy?: 1 - Never  Interpreter Needed?: No  Information [facility] :: [facility]  Past Medical History:  Diagnosis Date  . Arthritis   . Benign neoplasm of breast 2011  . Diffuse cystic mastopathy   . Family history of malignant neoplasm of breast   . Hypertension   . Lump or mass in breast    Past Surgical History:  Procedure Laterality Date  . BREAST BIOPSY Left 2013   core, apocine cyst. S marker  . BREAST BIOPSY Left 02/04/2017   Dr. SJamal CollinBX of 10:00 mass. R marker. INFLAMED BREAST PARENCHYMA WITH FIBROSIS  . BREAST CYST ASPIRATION Left 08/13/2016   FNA of 10:00 cyst done by DR. Sankar  . BREAST EXCISIONAL BIOPSY Left 2005   intraductal papilloma  . CATARACT  EXTRACTION W/PHACO Right 04/02/2017   Procedure: CATARACT EXTRACTION PHACO AND INTRAOCULAR LENS PLACEMENT (IOC);  Surgeon: PBirder Robson MD;  Location: [facility];  Service: Ophthalmology;  Laterality: Right;  [place] AP% 17.2 CDE 5.57 Fluid pack lot # [id number]  . CATARACT EXTRACTION W/PHACO Left 04/22/2017   Procedure: CATARACT EXTRA[place] AND [place]TRA[place] PLACEMENT [place];  Surgeon: PBirder Robson MD;  Location: [facility] OR[facility];  Service: Ophthalmology;  Laterality: Left;  [place] 00:36 AP% 13.6 CDE 4.95 Fluid pack lot # [id number]  . COLONOSCOPY  May 2008   Dr. SJamal Collin . TUBAL LIGATION  1980   Family History  Problem Relation Age of Onset  . Breast cancer Mother 672 . Diabetes Father   . Hypertension Sister        Colon Polyps   Social History   Socioeconomic History  . Marital status: Married    Spouse name: Not on file  . Number of children: 1  . Years of education: Not on file  . Highest education level: 12th grade  Occupational History  . Not on file  Social Needs  . Financial resource strain: Not hard at all  . Food insecurity:    Worry: Never true    Inability: Never true  . Transportation needs:    Medical: No    Non-medical: No  Tobacco Use  . Smoking status: Never  Smoker  . Smokeless tobacco: Never Used  Substance and Sexual Activity  . Alcohol use: No  . Drug use: No  . Sexual activity: Not on file  Lifestyle  . Physical activity:    Days per week: Not on file    Minutes per session: Not on file  . Stress: Not at all  Relationships  . Social connections:    Talks on phone: Not on file    Gets together: Not on file    Attends religious service: Not on file    Active member of club or organization: Not on file    Attends meetings of clubs or organizations: Not on file    Relationship status: Not on file  Other Topics Concern  . Not on file  Social History Narrative  . Not on file    Outpatient Encounter Medications as of 09/24/2017    Medication Sig  . aspirin 81 MG tablet Take 81 mg by mouth daily.  . Cholecalciferol 1000 UNITS tablet Take 1,000 Units by mouth daily.   . Difluprednate (DUREZOL) 0.05 % EMUL Place 1 drop into the left eye 2 (two) times daily.  Marland Kitchen escitalopram (LEXAPRO) 10 MG tablet Take 10 mg by mouth daily as needed (for aniexty/depression).  . fluticasone (FLONASE) 50 MCG/ACT nasal spray Place 2 sprays into both nostrils daily. (Patient taking differently: Place 2 sprays into both nostrils daily. )  . folic acid (FOLVITE) 008 MCG tablet Take 400 mcg daily by mouth.  Marland Kitchen LORazepam (ATIVAN) 0.5 MG tablet Take 0.5 tablets (0.25 mg total) by mouth 2 (two) times daily as needed for anxiety.  . meloxicam (MOBIC) 15 MG tablet Take 1 tablet (15 mg total) by mouth daily. (Patient taking differently: Take 15 mg by mouth daily. )  . Multiple Vitamin (MULTIVITAMIN) tablet Take 1 tablet by mouth daily.  . Omega-3 Fatty Acids (FISH OIL PO) Take 1,400 mg by mouth 2 (two) times daily.   . pravastatin (PRAVACHOL) 20 MG tablet TAKE 1 TABLET (20 MG TOTAL) BY MOUTH DAILY.   No facility-administered encounter medications on file as of 09/24/2017.     Activities of Daily Living In your present state of health, do you have any difficulty performing the following activities: 09/24/2017 07/16/2017  Hearing? N N  Vision? N N  Difficulty concentrating or making decisions? N N  Walking or climbing stairs? N N  Dressing or bathing? N N  Doing errands, shopping? N N  Preparing Food and eating ? N -  Using the Toilet? N -  In the past six months, have you accidently leaked urine? N -  Do you have problems with loss of bowel control? N -  Managing your Medications? N -  Managing your Finances? N -  Housekeeping or managing your Housekeeping? N -  Some recent data might be hidden    Patient Care Team: Bacigalupo, Dionne Bucy, MD as PCP - General (Family Medicine) Bary Castilla Forest Gleason, MD as Consulting Physician (General Surgery)     Assessment:   This is a routine wellness examination for Sonya Wade.  Exercise Activities and Dietary recommendations Current Exercise Habits: Home exercise routine, Type of exercise: walking, Time (Minutes): 60, Frequency (Times/Week): 6, Weekly Exercise (Minutes/Week): 360, Intensity: Mild, Exercise limited by: None identified  Goals    . DIET - REDUCE FAT INTAKE     Recommend to cut out all fried foods in diet to help aid in weight loss.     . Exercise 150 minutes per week (  moderate activity)       Fall Risk Fall Risk  09/24/2017 07/16/2017 06/03/2016 05/04/2015 11/04/2014  Falls in the past year? No No No Yes Yes  Number falls in past yr: - - - 1 1  Injury with Fall? - - - Yes Yes  Follow up - - - - Falls prevention discussed   Is the patient's home free of loose throw rugs in walkways, pet beds, electrical cords, etc?   yes      Grab bars in the bathroom? no      Handrails on the stairs?   yes      Adequate lighting?   yes  Timed Get Up and Go performed: N/A  Depression Screen PHQ 2/9 Scores 09/24/2017 07/16/2017 06/03/2016 05/04/2015  PHQ - 2 Score 0 0 0 0     Cognitive Function: Pt declined screening today.         Immunization History  Administered Date(s) Administered  . Influenza, High Dose Seasonal PF 02/18/2017  . Influenza-Unspecified 02/27/2015, 02/22/2016, 04/24/2017  . Pneumococcal Conjugate-13 04/22/2014  . Pneumococcal Polysaccharide-23 11/26/2011  . Tdap 10/31/2010  . Zoster 10/14/2014    Qualifies for Shingles Vaccine? Up to date on Shingrix.  Screening Tests Health Maintenance  Topic Date Due  . Fecal DNA (Cologuard)  07/05/1996  . INFLUENZA VACCINE  12/18/2017  . MAMMOGRAM  09/03/2019  . TETANUS/TDAP  10/30/2020  . DEXA SCAN  Completed  . Hepatitis C Screening  Completed  . PNA vac Low Risk Adult  Completed    Cancer Screenings: Lung: Low Dose CT Chest recommended if Age 53-80 years, 30 pack-year currently smoking OR have quit w/in 15years.  Patient does not qualify. Breast:  Up to date on Mammogram? Yes   Up to date of Bone Density/Dexa? Yes Colorectal: Completed cologuard recently. Awaiting cologuard results.   Additional Screenings:  Hepatitis C Screening: Up to date     Plan:  I have personally reviewed and addressed the Medicare Annual Wellness questionnaire and have noted the following in the patient's chart:  A. Medical and social history B. Use of alcohol, tobacco or illicit drugs  C. Current medications and supplements D. Functional ability and status E.  Nutritional status F.  Physical activity G. Advance directives H. List of other physicians I.  Hospitalizations, surgeries, and ER visits in previous 12 months J.  [place] such as hearing and vision if needed, cognitive and depression L. Referrals and appointments - none  In addition, I have reviewed and discussed with patient certain preventive protocols, quality metrics, and best practice recommendations. A written personalized care plan for preventive services as well as general preventive health recommendations were provided to patient.  See attached scanned questionnaire for additional information.   Signed,  Fabio Neighbors, LPN Nurse Health Advisor   Nurse Recommendations: None. Pt is currently awaiting the results of her cologuard kit.

## 2017-09-24 NOTE — Patient Instructions (Addendum)
Ms. Sonya Wade , Thank you for taking time to come for your Medicare Wellness Visit. I appreciate your ongoing commitment to your health goals. Please review the following plan we discussed and let me know if I can assist you in the future.   Screening recommendations/referrals: Colonoscopy: Awaiting cologuard results from Dr Bary Castilla. Mammogram: Up to date Bone Density: Up to date Recommended yearly ophthalmology/optometry visit for glaucoma screening and checkup Recommended yearly dental visit for hygiene and checkup  Vaccinations: Influenza vaccine: Up to date Pneumococcal vaccine: Up to date Tdap vaccine: Up to date Shingles vaccine: Up to date on Shingrix.     Advanced directives: Advance directive discussed with you today. Even though you declined this today please call our office should you change your mind and we can give you the proper paperwork for you to fill out.  Conditions/risks identified: Recommend to cut out all fried foods in diet to help aid in weight loss.   Next appointment: 10/14/17 @ 9:00 AM with Dr Brita Romp.   Preventive Care 23 Years and Older, Female Preventive care refers to lifestyle choices and visits with your health care provider that can promote health and wellness. What does preventive care include?  A yearly physical exam. This is also called an annual well check.  Dental exams once or twice a year.  Routine eye exams. Ask your health care provider how often you should have your eyes checked.  Personal lifestyle choices, including:  Daily care of your teeth and gums.  Regular physical activity.  Eating a healthy diet.  Avoiding tobacco and drug use.  Limiting alcohol use.  Practicing safe sex.  Taking low-dose aspirin every day.  Taking vitamin and mineral supplements as recommended by your health care provider. What happens during an annual well check? The services and screenings done by your health care provider during your  annual well check will depend on your age, overall health, lifestyle risk factors, and family history of disease. Counseling  Your health care provider may ask you questions about your:  Alcohol use.  Tobacco use.  Drug use.  Emotional well-being.  Home and relationship well-being.  Sexual activity.  Eating habits.  History of falls.  Memory and ability to understand (cognition).  Work and work Statistician.  Reproductive health. Screening  You may have the following tests or measurements:  Height, weight, and BMI.  Blood pressure.  Lipid and cholesterol levels. These may be checked every 5 years, or more frequently if you are over 10 years old.  Skin check.  Lung cancer screening. You may have this screening every year starting at age 39 if you have a 30-pack-year history of smoking and currently smoke or have quit within the past 15 years.  Fecal occult blood test (FOBT) of the stool. You may have this test every year starting at age 16.  Flexible sigmoidoscopy or colonoscopy. You may have a sigmoidoscopy every 5 years or a colonoscopy every 10 years starting at age 85.  Hepatitis C blood test.  Hepatitis B blood test.  Sexually transmitted disease (STD) testing.  Diabetes screening. This is done by checking your blood sugar (glucose) after you have not eaten for a while (fasting). You may have this done every 1-3 years.  Bone density scan. This is done to screen for osteoporosis. You may have this done starting at age 46.  Mammogram. This may be done every 1-2 years. Talk to your health care provider about how often you should have regular mammograms. Talk  with your health care provider about your test results, treatment options, and if necessary, the need for more tests. Vaccines  Your health care provider may recommend certain vaccines, such as:  Influenza vaccine. This is recommended every year.  Tetanus, diphtheria, and acellular pertussis (Tdap, Td)  vaccine. You may need a Td booster every 10 years.  Zoster vaccine. You may need this after age 21.  Pneumococcal 13-valent conjugate (PCV13) vaccine. One dose is recommended after age 44.  Pneumococcal polysaccharide (PPSV23) vaccine. One dose is recommended after age 27. Talk to your health care provider about which screenings and vaccines you need and how often you need them. This information is not intended to replace advice given to you by your health care provider. Make sure you discuss any questions you have with your health care provider. Document Released: 06/02/2015 Document Revised: 01/24/2016 Document Reviewed: 03/07/2015 Elsevier Interactive Patient Education  2017 [place] Prevention in the Home Falls can cause injuries. They can happen to people of all ages. There are many things you can do to make your home safe and to help prevent falls. What can I do on the outside of my home?  Regularly fix the edges of walkways and driveways and fix any cracks.  Remove anything that might make you trip as you walk through a door, such as a raised step or threshold.  Trim any bushes or trees on the path to your home.  Use bright outdoor lighting.  Clear any walking paths of anything that might make someone trip, such as rocks or tools.  Regularly check to see if handrails are loose or broken. Make sure that both sides of any steps have handrails.  Any raised decks and porches should have guardrails on the edges.  Have any leaves, snow, or ice cleared regularly.  Use sand or salt on walking paths during winter.  Clean up any spills in your garage right away. This includes oil or grease spills. What can I do in the bathroom?  Use night lights.  Install grab bars by the toilet and in the tub and shower. Do not use towel bars as grab bars.  Use non-skid mats or decals in the tub or shower.  If you need to sit down in the shower, use a plastic, non-slip  stool.  Keep the floor dry. Clean up any water that spills on the floor as soon as it happens.  Remove soap buildup in the tub or shower regularly.  Attach bath mats securely with double-sided non-slip rug tape.  Do not have throw rugs and other things on the floor that can make you trip. What can I do in the bedroom?  Use night lights.  Make sure that you have a light by your bed that is easy to reach.  Do not use any sheets or blankets that are too big for your bed. They should not hang down onto the floor.  Have a firm chair that has side arms. You can use this for support while you get dressed.  Do not have throw rugs and other things on the floor that can make you trip. What can I do in the kitchen?  Clean up any spills right away.  Avoid walking on wet floors.  Keep items that you use a lot in easy-to-reach places.  If you need to reach something above you, use a strong step stool that has a grab bar.  Keep electrical cords out of the way.  Do  not use floor polish or wax that makes floors slippery. If you must use wax, use non-skid floor wax.  Do not have throw rugs and other things on the floor that can make you trip. What can I do with my stairs?  Do not leave any items on the stairs.  Make sure that there are handrails on both sides of the stairs and use them. Fix handrails that are broken or loose. Make sure that handrails are as long as the stairways.  Check any carpeting to make sure that it is firmly attached to the stairs. Fix any carpet that is loose or worn.  Avoid having throw rugs at the top or bottom of the stairs. If you do have throw rugs, attach them to the floor with carpet tape.  Make sure that you have a light switch at the top of the stairs and the bottom of the stairs. If you do not have them, ask someone to add them for you. What else can I do to help prevent falls?  Wear shoes that:  Do not have high heels.  Have rubber bottoms.  Are  comfortable and fit you well.  Are closed at the toe. Do not wear sandals.  If you use a stepladder:  Make sure that it is fully opened. Do not climb a closed stepladder.  Make sure that both sides of the stepladder are locked into place.  Ask someone to hold it for you, if possible.  Clearly mark and make sure that you can see:  Any grab bars or handrails.  First and last steps.  Where the edge of each step is.  Use tools that help you move around (mobility aids) if they are needed. These include:  Canes.  Walkers.  Scooters.  Crutches.  Turn on the lights when you go into a dark area. Replace any light bulbs as soon as they burn out.  Set up your furniture so you have a clear path. Avoid moving your furniture around.  If any of your floors are uneven, fix them.  If there are any pets around you, be aware of where they are.  Review your medicines with your doctor. Some medicines can make you feel dizzy. This can increase your chance of falling. Ask your doctor what other things that you can do to help prevent falls. This information is not intended to replace advice given to you by your health care provider. Make sure you discuss any questions you have with your health care provider. Document Released: 03/02/2009 Document Revised: 10/12/2015 Document Reviewed: 06/10/2014 Elsevier Interactive Patient Education  2017 [employer].

## 2017-10-05 LAB — COLOGUARD

## 2017-10-06 ENCOUNTER — Encounter: Payer: Self-pay | Admitting: General Surgery

## 2017-10-08 ENCOUNTER — Telehealth: Payer: Self-pay

## 2017-10-08 NOTE — Telephone Encounter (Signed)
Notified patient as instructed, patient pleased. Discussed follow-up appointments, patient agrees  

## 2017-10-08 NOTE — Telephone Encounter (Signed)
-----   Message from Robert Bellow, MD sent at 10/08/2017  7:14 AM EDT ----- Please notify the patient Cologuard test was negative. She should have this repeated in three years with her PCP unless she elects to proceed with a colonoscopy.  ----- Message ----- From: Augustin Schooling, CMA Sent: 10/06/2017  11:36 AM To: Robert Bellow, MD

## 2017-10-14 ENCOUNTER — Encounter: Payer: Medicare Other | Admitting: Family Medicine

## 2017-10-23 DIAGNOSIS — H209 Unspecified iridocyclitis: Secondary | ICD-10-CM | POA: Diagnosis not present

## 2017-10-24 ENCOUNTER — Ambulatory Visit (INDEPENDENT_AMBULATORY_CARE_PROVIDER_SITE_OTHER): Payer: Medicare Other | Admitting: Family Medicine

## 2017-10-24 ENCOUNTER — Encounter: Payer: Self-pay | Admitting: Family Medicine

## 2017-10-24 VITALS — BP 100/64 | HR 61 | Temp 98.0°F | Resp 16 | Ht 64.0 in | Wt 168.0 lb

## 2017-10-24 DIAGNOSIS — E785 Hyperlipidemia, unspecified: Secondary | ICD-10-CM | POA: Diagnosis not present

## 2017-10-24 DIAGNOSIS — Z780 Asymptomatic menopausal state: Secondary | ICD-10-CM | POA: Diagnosis not present

## 2017-10-24 DIAGNOSIS — M8580 Other specified disorders of bone density and structure, unspecified site: Secondary | ICD-10-CM

## 2017-10-24 DIAGNOSIS — D72819 Decreased white blood cell count, unspecified: Secondary | ICD-10-CM

## 2017-10-24 DIAGNOSIS — R7303 Prediabetes: Secondary | ICD-10-CM | POA: Diagnosis not present

## 2017-10-24 DIAGNOSIS — Z0000 Encounter for general adult medical examination without abnormal findings: Secondary | ICD-10-CM | POA: Diagnosis not present

## 2017-10-24 NOTE — Progress Notes (Signed)
Patient: Sonya Wade, Female    DOB: 1945/10/14, 72 y.o.   MRN: [id number] Visit Date: 10/24/2017  Today's Provider: Lavon Paganini, MD   I, Martha Clan, CMA, am acting as scribe for Lavon Paganini, MD.  Chief Complaint  Patient presents with  . Annual Exam   Subjective:     Complete Physical Sonya Wade is a 72 y.o. female. She feels well. She reports exercising every morning. Walks with her husband and her dog. She reports she is sleeping fairly well.  Last [facility]- 09/24/2017 Last mammo (right breast US)-09/09/2017- BI-RADS 3. Recheck in 6 months per radiologist. Per pt, her surgeon advised her that this only needs to be rechecked yearly. Last BMD- 07/03/2015- osteopenia. Agrees to update this. Last colonoscopy- had Cologuard on 10/05/2017, and was negative. -----------------------------------------------------------   Review of Systems  Constitutional: Negative.   HENT: Negative.   Eyes: Negative.   Respiratory: Negative.   Cardiovascular: Negative.   Gastrointestinal: Negative.   Endocrine: Negative.   Genitourinary: Negative.   Musculoskeletal: Positive for arthralgias. Negative for back pain, gait problem, joint swelling, myalgias, neck pain and neck stiffness.  Skin: Negative.   Allergic/Immunologic: Negative.   Neurological: Negative.   Hematological: Negative.   Psychiatric/Behavioral: Negative.     Social History   Socioeconomic History  . Marital status: Married    Spouse name: Not on file  . Number of children: 1  . Years of education: Not on file  . Highest education level: 12th grade  Occupational History  . Not on file  Social Needs  . Financial resource strain: Not hard at all  . Food insecurity:    Worry: Never true    Inability: Never true  . Transportation needs:    Medical: No    Non-medical: No  Tobacco Use  . Smoking status: Never Smoker  . Smokeless tobacco: Never Used  Substance and Sexual  Activity  . Alcohol use: No  . Drug use: No  . Sexual activity: Not on file  Lifestyle  . Physical activity:    Days per week: Not on file    Minutes per session: Not on file  . Stress: Not at all  Relationships  . Social connections:    Talks on phone: Not on file    Gets together: Not on file    Attends religious service: Not on file    Active member of club or organization: Not on file    Attends meetings of clubs or organizations: Not on file    Relationship status: Not on file  . Intimate partner violence:    Fear of current or ex partner: Not on file    Emotionally abused: Not on file    Physically abused: Not on file    Forced sexual activity: Not on file  Other Topics Concern  . Not on file  Social History Narrative  . Not on file    Past Medical History:  Diagnosis Date  . Arthritis   . Benign neoplasm of breast 2011  . Diffuse cystic mastopathy   . Family history of malignant neoplasm of breast   . Hypertension   . Lump or mass in breast      Patient Active Problem List   Diagnosis Date Noted  . Pre-diabetes 06/03/2016  . Diffuse cystic mastopathy   . Family history of malignant neoplasm of breast   . Hyperlipemia 11/09/2014  . Anxiety and depression 09/19/2014  . Arthritis of knee,  degenerative 09/19/2014  . Esophageal reflux 09/19/2014  . Arthritis, degenerative 11/16/2013  . Family history of breast cancer 07/07/2013  . Fibrocystic breast disease 07/07/2013  . Bloodgood disease 07/07/2013  . Decreased leukocytes 09/15/2009  . Allergic rhinitis 09/06/2009    Past Surgical History:  Procedure Laterality Date  . BREAST BIOPSY Left 2013   core, apocine cyst. S marker  . BREAST BIOPSY Left 02/04/2017   Dr. Jamal Collin BX of 10:00 mass. R marker. INFLAMED BREAST PARENCHYMA WITH FIBROSIS  . BREAST CYST ASPIRATION Left 08/13/2016   FNA of 10:00 cyst done by DR. Sankar  . BREAST EXCISIONAL BIOPSY Left 2005   intraductal papilloma  . CATARACT EXTRACTION  W/PHACO Right 04/02/2017   Procedure: CATARACT EXTRACTION PHACO AND INTRAOCULAR LENS PLACEMENT (IOC);  Surgeon: Birder Robson, MD;  Location: [facility] ORS;  Service: Ophthalmology;  Laterality: Right;  [place] 00:32 AP% 17.2 CDE 5.57 Fluid pack lot # [id number]  . CATARACT EXTRACTION W/PHACO Left 04/22/2017   Procedure: CATARACT EXTRACTION PHACO AND INTRAOCULAR LENS PLACEMENT ([place]);  Surgeon: Birder Robson, MD;  Location: [facility] ORS;  Service: Ophthalmology;  Laterality: Left;  [place]  00:36 AP% 13.6 CDE 4.95 Fluid pack lot # [id number]  . COLONOSCOPY  May 2008   Dr. Jamal Collin  . TUBAL LIGATION  1980    Her family history includes Breast cancer (age of onset: 40) in her mother; Diabetes in her father; Hypertension in her sister.      Current Outpatient Medications:  .  aspirin 81 MG tablet, Take 81 mg by mouth daily., Disp: , Rfl:  .  Cholecalciferol 1000 UNITS tablet, Take 1,000 Units by mouth daily. , Disp: , Rfl:  .  Difluprednate (DUREZOL) 0.05 % EMUL, Place 1 drop into the left eye 2 (two) times daily., Disp: , Rfl:  .  fluticasone (FLONASE) 50 MCG/ACT nasal spray, Place 2 sprays into both nostrils daily. (Patient taking differently: Place 2 sprays into both nostrils daily. ), Disp: 16 g, Rfl: 6 .  folic acid (FOLVITE) 952 MCG tablet, Take 400 mcg daily by mouth., Disp: , Rfl:  .  LORazepam (ATIVAN) 0.5 MG tablet, Take 0.5 tablets (0.25 mg total) by mouth 2 (two) times daily as needed for anxiety., Disp: 10 tablet, Rfl: 0 .  meloxicam (MOBIC) 15 MG tablet, Take 1 tablet (15 mg total) by mouth daily. (Patient taking differently: Take 15 mg by mouth daily. ), Disp: 30 tablet, Rfl: 1 .  Multiple Vitamin (MULTIVITAMIN) tablet, Take 1 tablet by mouth daily., Disp: , Rfl:  .  Omega-3 Fatty Acids (FISH OIL PO), Take 1,400 mg by mouth 2 (two) times daily. , Disp: , Rfl:  .  pravastatin (PRAVACHOL) 20 MG tablet, TAKE 1 TABLET (20 MG TOTAL) BY MOUTH DAILY. (Patient not taking: Reported on 10/24/2017), Disp: 90  tablet, Rfl: 0  Patient Care Team: Virginia Crews, MD as PCP - General (Family Medicine) Bary Castilla, Forest Gleason, MD as Consulting Physician (General Surgery)     Objective:   Vitals: BP 100/64 (BP Location: Left Arm, Patient Position: Sitting, Cuff Size: Large)   Pulse 61   Temp 98 F (36.7 C) (Oral)   Resp 16   Ht 5\' 4"  (1.626 m)   Wt 168 lb (76.2 kg)   SpO2 97%   BMI 28.84 kg/m   Physical Exam  Constitutional: She is oriented to person, place, and time. She appears well-developed and well-nourished. No distress.  HENT:  Head: Normocephalic and atraumatic.  Right Ear: External ear normal.  Left Ear: External ear normal.  Nose: Nose normal.  Mouth/Throat: Oropharynx is clear and moist.  Eyes: Pupils are equal, round, and reactive to light. Conjunctivae and EOM are normal. No scleral icterus.  Neck: Neck supple. No thyromegaly present.  Cardiovascular: Normal rate, regular rhythm, normal heart sounds and intact distal pulses.  No murmur heard. Pulmonary/Chest: Effort normal and breath sounds normal. No respiratory distress. She has no wheezes. She has no rales.  Abdominal: Soft. Bowel sounds are normal. She exhibits no distension. There is no tenderness. There is no rebound and no guarding.  Musculoskeletal: She exhibits no edema or deformity.  Lymphadenopathy:    She has no cervical adenopathy.  Neurological: She is alert and oriented to person, place, and time.  Skin: Skin is warm and dry. Capillary refill takes less than 2 seconds. No rash noted.  Psychiatric: She has a normal mood and affect. Her behavior is normal.  Vitals reviewed.   Activities of Daily Living In your present state of health, do you have any difficulty performing the following activities: 09/24/2017 07/16/2017  Hearing? N N  Vision? N N  Difficulty concentrating or making decisions? N N  Walking or climbing stairs? N N  Dressing or bathing? N N  Doing errands, shopping? N N  Preparing Food and  eating ? N -  Using the Toilet? N -  In the past six months, have you accidently leaked urine? N -  Do you have problems with loss of bowel control? N -  Managing your Medications? N -  Managing your Finances? N -  Housekeeping or managing your Housekeeping? N -  Some recent data might be hidden    Fall Risk Assessment Fall Risk  09/24/2017 07/16/2017 06/03/2016 05/04/2015 11/04/2014  Falls in the past year? No No No Yes Yes  Number falls in past yr: - - - 1 1  Injury with Fall? - - - Yes Yes  Follow up - - - - Falls prevention discussed     Depression Screen PHQ 2/9 Scores 09/24/2017 07/16/2017 06/03/2016 05/04/2015  PHQ - 2 Score 0 0 0 0    Pt declined 6CIT screening at [facility].   Assessment & Plan:    Annual Physical Reviewed patient's Family Medical History Reviewed and updated list of patient's medical providers Assessment of cognitive impairment was done Assessed patient's functional ability Established a written schedule for health screening [place] Completed and Reviewed  Exercise Activities and Dietary recommendations Goals    . DIET - REDUCE FAT INTAKE     Recommend to cut out all fried foods in diet to help aid in weight loss.     . Exercise 150 minutes per week (moderate activity)       Immunization History  Administered Date(s) Administered  . Influenza, High Dose Seasonal PF 02/18/2017  . Influenza-Unspecified 02/27/2015, 02/22/2016, 04/24/2017  . Pneumococcal Conjugate-13 04/22/2014  . Pneumococcal Polysaccharide-23 11/26/2011  . Tdap 10/31/2010  . Zoster 10/14/2014    Health Maintenance  Topic Date Due  . INFLUENZA VACCINE  12/18/2017  . MAMMOGRAM  09/03/2019  . Fecal DNA (Cologuard)  09/23/2020  . TETANUS/TDAP  10/30/2020  . DEXA SCAN  Completed  . Hepatitis C Screening  Completed  . PNA vac Low Risk Adult  Completed     Discussed health benefits of physical activity, and encouraged her to engage in regular exercise  appropriate for her age and condition.    ------------------------------------------------------------------------------------------------------------ Problem List Items Addressed This Visit  Other   Decreased leukocytes   Relevant Orders   CBC   Hyperlipemia   Relevant Orders   Lipid panel   Comprehensive metabolic panel   Pre-diabetes   Relevant Orders   Hemoglobin A1c    Other Visit Diagnoses    Encounter for annual physical exam    -  Primary   Relevant Orders   DG Bone Density   Lipid panel   Comprehensive metabolic panel   CBC   Hemoglobin A1c   Osteopenia, unspecified location       Relevant Orders   DG Bone Density   Postmenopausal       Relevant Orders   DG Bone Density       Return in about 6 months (around 04/25/2018) for anxiety f/u.   The entirety of the information documented in the History of Present Illness, Review of Systems and Physical Exam were personally obtained by me. Portions of this information were initially documented by Raquel Sarna Ratchford, CMA and reviewed by me for thoroughness and accuracy.    Virginia Crews, MD, MPH [facility] 10/24/2017 2:31 PM

## 2017-10-24 NOTE — Patient Instructions (Signed)
Preventive Care 71 Years and Older, Female Preventive care refers to lifestyle choices and visits with your health care provider that can promote health and wellness. What does preventive care include?  A yearly physical exam. This is also called an annual well check.  Dental exams once or twice a year.  Routine eye exams. Ask your health care provider how often you should have your eyes checked.  Personal lifestyle choices, including: ? Daily care of your teeth and gums. ? Regular physical activity. ? Eating a healthy diet. ? Avoiding tobacco and drug use. ? Limiting alcohol use. ? Practicing safe sex. ? Taking low-dose aspirin every day. ? Taking vitamin and mineral supplements as recommended by your health care provider. What happens during an annual well check? The services and screenings done by your health care provider during your annual well check will depend on your age, overall health, lifestyle risk factors, and family history of disease. Counseling Your health care provider may ask you questions about your:  Alcohol use.  Tobacco use.  Drug use.  Emotional well-being.  Home and relationship well-being.  Sexual activity.  Eating habits.  History of falls.  Memory and ability to understand (cognition).  Work and work environment.  Reproductive health.  Screening You may have the following tests or measurements:  Height, weight, and BMI.  Blood pressure.  Lipid and cholesterol levels. These may be checked every 5 years, or more frequently if you are over 50 years old.  Skin check.  Lung cancer screening. You may have this screening every year starting at age 55 if you have a 30-pack-year history of smoking and currently smoke or have quit within the past 15 years.  Fecal occult blood test (FOBT) of the stool. You may have this test every year starting at age 50.  Flexible sigmoidoscopy or colonoscopy. You may have a sigmoidoscopy every 5 years or  a colonoscopy every 10 years starting at age 50.  Hepatitis C blood test.  Hepatitis B blood test.  Sexually transmitted disease (STD) testing.  Diabetes screening. This is done by checking your blood sugar (glucose) after you have not eaten for a while (fasting). You may have this done every 1-3 years.  Bone density scan. This is done to screen for osteoporosis. You may have this done starting at age 65.  Mammogram. This may be done every 1-2 years. Talk to your health care provider about how often you should have regular mammograms.  Talk with your health care provider about your test results, treatment options, and if necessary, the need for more tests. Vaccines Your health care provider may recommend certain vaccines, such as:  Influenza vaccine. This is recommended every year.  Tetanus, diphtheria, and acellular pertussis (Tdap, Td) vaccine. You may need a Td booster every 10 years.  Varicella vaccine. You may need this if you have not been vaccinated.  Zoster vaccine. You may need this after age 60.  Measles, mumps, and rubella (MMR) vaccine. You may need at least one dose of MMR if you were born in 1957 or later. You may also need a second dose.  Pneumococcal 13-valent conjugate (PCV13) vaccine. One dose is recommended after age 65.  Pneumococcal polysaccharide (PPSV23) vaccine. One dose is recommended after age 65.  Meningococcal vaccine. You may need this if you have certain conditions.  Hepatitis A vaccine. You may need this if you have certain conditions or if you travel or work in places where you may be exposed to hepatitis   A.  Hepatitis B vaccine. You may need this if you have certain conditions or if you travel or work in places where you may be exposed to hepatitis B.  Haemophilus influenzae type b (Hib) vaccine. You may need this if you have certain conditions.  Talk to your health care provider about which screenings and vaccines you need and how often you  need them. This information is not intended to replace advice given to you by your health care provider. Make sure you discuss any questions you have with your health care provider. Document Released: 06/02/2015 Document Revised: 01/24/2016 Document Reviewed: 03/07/2015 Elsevier Interactive Patient Education  2018 [employer] Inc[employer]  

## 2017-11-03 DIAGNOSIS — H209 Unspecified iridocyclitis: Secondary | ICD-10-CM | POA: Diagnosis not present

## 2017-11-11 DIAGNOSIS — H209 Unspecified iridocyclitis: Secondary | ICD-10-CM | POA: Diagnosis not present

## 2017-11-13 DIAGNOSIS — R7303 Prediabetes: Secondary | ICD-10-CM | POA: Diagnosis not present

## 2017-11-13 DIAGNOSIS — D72819 Decreased white blood cell count, unspecified: Secondary | ICD-10-CM | POA: Diagnosis not present

## 2017-11-13 DIAGNOSIS — Z0000 Encounter for general adult medical examination without abnormal findings: Secondary | ICD-10-CM | POA: Diagnosis not present

## 2017-11-13 DIAGNOSIS — E785 Hyperlipidemia, unspecified: Secondary | ICD-10-CM | POA: Diagnosis not present

## 2017-11-14 LAB — CBC
HEMATOCRIT: 41.9 % (ref 34.0–46.6)
Hemoglobin: 13.1 g/dL (ref 11.1–15.9)
MCH: 26.5 pg — AB (ref 26.6–33.0)
MCHC: 31.3 g/dL — ABNORMAL LOW (ref 31.5–35.7)
MCV: 85 fL (ref 79–97)
Platelets: 217 10*3/uL (ref 150–450)
RBC: 4.95 x10E6/uL (ref 3.77–5.28)
RDW: 14.6 % (ref 12.3–15.4)
WBC: 3.6 10*3/uL (ref 3.4–10.8)

## 2017-11-14 LAB — COMPREHENSIVE METABOLIC PANEL
ALBUMIN: 4.2 g/dL (ref 3.5–4.8)
ALT: 12 IU/L (ref 0–32)
AST: 20 IU/L (ref 0–40)
Albumin/Globulin Ratio: 1.8 (ref 1.2–2.2)
Alkaline Phosphatase: 82 IU/L (ref 39–117)
BUN / CREAT RATIO: 20 (ref 12–28)
BUN: 20 mg/dL (ref 8–27)
Bilirubin Total: 0.2 mg/dL (ref 0.0–1.2)
CALCIUM: 9.4 mg/dL (ref 8.7–10.3)
CO2: 23 mmol/L (ref 20–29)
CREATININE: 0.98 mg/dL (ref 0.57–1.00)
Chloride: 106 mmol/L (ref 96–106)
GFR calc Af Amer: 67 mL/min/{1.73_m2} (ref >59)
GFR, EST NON AFRICAN AMERICAN: 58 mL/min/{1.73_m2} — AB (ref >59)
GLOBULIN, TOTAL: 2.4 g/dL (ref 1.5–4.5)
Glucose: 91 mg/dL (ref 65–99)
Potassium: 4.4 mmol/L (ref 3.5–5.2)
SODIUM: 144 mmol/L (ref 134–144)
Total Protein: 6.6 g/dL (ref 6.0–8.5)

## 2017-11-14 LAB — LIPID PANEL
CHOLESTEROL TOTAL: 236 mg/dL — AB (ref 100–199)
Chol/HDL Ratio: 3.5 ratio (ref 0.0–4.4)
HDL: 67 mg/dL (ref >39)
LDL Calculated: 143 mg/dL — ABNORMAL HIGH (ref 0–99)
Triglycerides: 130 mg/dL (ref 0–149)
VLDL CHOLESTEROL CAL: 26 mg/dL (ref 5–40)

## 2017-11-14 LAB — HEMOGLOBIN A1C
Est. average glucose Bld gHb Est-mCnc: 120 mg/dL
HEMOGLOBIN A1C: 5.8 % — AB (ref 4.8–5.6)

## 2017-11-24 ENCOUNTER — Telehealth: Payer: Self-pay

## 2017-11-24 NOTE — Telephone Encounter (Signed)
Pt advised. States she stopped taking pravastatin due to knee pain. The rx also ran out, and pt never refilled this. Please advise.

## 2017-11-24 NOTE — Telephone Encounter (Signed)
-----   Message from Virginia Crews, MD sent at 11/24/2017  2:39 PM EDT ----- Cholesterol is elevated since last check.  Is patient taking statin as prescribed?  If so, would recommend increasing dose.  Normal kidney function, liver function, electrolytes, Blood counts.  Hemoglobin A1c is slightly better but remains in pre-diabetes range.  Recommend regular exercise and low carb diet.  Virginia Crews, MD, MPH [facility] 11/24/2017 2:39 PM

## 2017-11-24 NOTE — Telephone Encounter (Signed)
OK let's try Atorvastatin 20mg  daily.  OK to send Rx for #30 [id number].  Virginia Crews, MD, MPH [facility] 11/24/2017 4:53 PM

## 2017-11-25 ENCOUNTER — Telehealth: Payer: Self-pay | Admitting: Family Medicine

## 2017-11-25 MED ORDER — ATORVASTATIN CALCIUM 20 MG PO TABS: 20.0000 mg | ORAL_TABLET | Freq: Every day | ORAL | 11 refills | Status: DC

## 2017-11-25 NOTE — Telephone Encounter (Signed)
Patient advised as below. Patient verbalizes understanding and is in agreement with treatment plan.  

## 2017-11-25 NOTE — Telephone Encounter (Signed)
Ok to send

## 2017-11-25 NOTE — Telephone Encounter (Signed)
Pt stated that atorvastatin (LIPITOR) 20 MG tablet was to expensive and she would rather go back to taking pravastatin (PRAVACHOL) 20 MG tablet. Pt is requesting a new Rx for pravastatin (PRAVACHOL) 20 MG tablet be sent to [facility] [address]. Please advise. Thanks TNP

## 2017-11-26 MED ORDER — PRAVASTATIN SODIUM 20 MG PO TABS: 20.0000 mg | ORAL_TABLET | Freq: Every day | ORAL | 11 refills | Status: DC

## 2017-11-26 NOTE — Telephone Encounter (Signed)
Sent and pt advised.

## 2017-11-26 NOTE — Telephone Encounter (Signed)
OK to resume pravastatin 20mg  daily.  Please send Rx with 12 month supply.  Please advise her to start OTC supplement with CoQ10 daily to help with any possible muscle aches pravastatin was causing  Brita Romp, Dionne Bucy, MD, MPH [facility] 11/26/2017 10:18 AM

## 2017-12-10 ENCOUNTER — Other Ambulatory Visit: Payer: PRIVATE HEALTH INSURANCE

## 2018-01-31 ENCOUNTER — Other Ambulatory Visit: Payer: Self-pay | Admitting: Physician Assistant

## 2018-02-06 DIAGNOSIS — Z23 Encounter for immunization: Secondary | ICD-10-CM | POA: Diagnosis not present

## 2018-02-17 ENCOUNTER — Other Ambulatory Visit: Payer: Self-pay | Admitting: Family Medicine

## 2018-02-17 DIAGNOSIS — M1710 Unilateral primary osteoarthritis, unspecified knee: Secondary | ICD-10-CM

## 2018-02-17 DIAGNOSIS — M179 Osteoarthritis of knee, unspecified: Secondary | ICD-10-CM

## 2018-03-11 DIAGNOSIS — Z961 Presence of intraocular lens: Secondary | ICD-10-CM | POA: Diagnosis not present

## 2018-03-27 ENCOUNTER — Ambulatory Visit (INDEPENDENT_AMBULATORY_CARE_PROVIDER_SITE_OTHER): Payer: Medicare Other | Admitting: Family Medicine

## 2018-03-27 ENCOUNTER — Encounter: Payer: Self-pay | Admitting: Family Medicine

## 2018-03-27 DIAGNOSIS — M170 Bilateral primary osteoarthritis of knee: Secondary | ICD-10-CM

## 2018-03-27 DIAGNOSIS — Z5181 Encounter for therapeutic drug level monitoring: Secondary | ICD-10-CM

## 2018-03-27 DIAGNOSIS — F419 Anxiety disorder, unspecified: Secondary | ICD-10-CM | POA: Diagnosis not present

## 2018-03-27 DIAGNOSIS — J302 Other seasonal allergic rhinitis: Secondary | ICD-10-CM

## 2018-03-27 DIAGNOSIS — E782 Mixed hyperlipidemia: Secondary | ICD-10-CM | POA: Diagnosis not present

## 2018-03-27 DIAGNOSIS — F329 Major depressive disorder, single episode, unspecified: Secondary | ICD-10-CM

## 2018-03-27 DIAGNOSIS — R7303 Prediabetes: Secondary | ICD-10-CM | POA: Diagnosis not present

## 2018-03-27 MED ORDER — ESCITALOPRAM OXALATE 10 MG PO TABS: 5.0000 mg | ORAL_TABLET | Freq: Every day | ORAL | 2 refills | Status: DC

## 2018-03-27 MED ORDER — ATORVASTATIN CALCIUM 10 MG PO TABS: 10.0000 mg | ORAL_TABLET | Freq: Every day | ORAL | 1 refills | Status: DC

## 2018-03-27 NOTE — Progress Notes (Signed)
BP 108/66   Pulse 70   Temp 98.4 F (36.9 C) (Oral)   Ht 5\' 4"  (1.626 m)   Wt 167 lb (75.8 kg)   SpO2 98%   BMI 28.67 kg/m    Subjective:    Patient ID: Sonya Wade, female    DOB: 1946/04/20, 72 y.o.   MRN: [id number]  HPI: Sonya Wade is a 72 y.o. female  Chief Complaint  Patient presents with  . New Patient (Initial Visit)  . Hyperlipidemia    chol med causing joint pain in knee    HPI Patient is here to establish care  High cholesterol; taking pravastatin 20 mg; when she started this it made her right knee hurt; tried stopping it and the knee got better; started back and the knee hurt again; tries to watch a good diet LDL was 143 in June and that was when the medicine got started  Arthritis in the knees; taking meloxicam, that helps; has had xrays done; taking for 3 days and knocks it right out; no need for surgery coming up; did got some shots in the knees, ortho at [place] Dr. 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; they won't let her stop; helps her deal with people; the medicine helps her be relaxed; she just takes one a month on the 3rd Saturday of the month  Prediabetes; on A1c readings; DM runs in the family; no dry mouth; no blurred vision  She has problems using the bathroom; some days she might not use it all and then other days more often; wants natural treatment  Depression screen [facility] 2/9 04/06/2018 03/27/2018 09/24/2017 07/16/2017 06/03/2016  Decreased Interest 0 0 0 0 0  Down, Depressed, Hopeless 0 0 0 0 0  PHQ - 2 Score 0 0 0 0 0  Altered sleeping 0 0 - - -  Tired, decreased energy 0 0 - - -  Change in appetite 0 0 - - -  Feeling bad or failure about yourself   0 0 - - -  Trouble concentrating 0 0 - - -  Moving slowly or fidgety/restless 0 0 - - -  Suicidal thoughts 0 0 - - -  PHQ-9 Score 0 0 - - -  Difficult doing work/chores Not difficult at all Not difficult at all - - -   Fall Risk  04/06/2018 03/27/2018 09/24/2017 07/16/2017 06/03/2016  Falls in the past year? 0 0 No No No  Number falls in past yr: - 0 - - -  Injury with Fall? - 0 - - -  Follow up - - - - -    Relevant past medical, surgical, family and social history reviewed Past Medical History:  Diagnosis Date  . Arthritis   . Benign neoplasm of breast 2011  . Diffuse cystic mastopathy   . Family history of malignant neoplasm of breast   . Hyperlipidemia   . Lump or mass in breast    Past Surgical History:  Procedure Laterality Date  . BREAST BIOPSY Left 2013   core, apocine cyst. S marker  . BREAST BIOPSY Left 02/04/2017   Dr. Jamal Collin BX of 10:00 mass. R marker. INFLAMED BREAST  PARENCHYMA WITH FIBROSIS  . BREAST CYST ASPIRATION Left 08/13/2016   FNA of 10:00 cyst done by DR. Sankar  . BREAST EXCISIONAL BIOPSY Left 2005   intraductal papilloma  . CATARACT EXTRACTION W/PHACO Right 04/02/2017   Procedure: CATARACT EXTRACTION PHACO AND INTRAOCULAR LENS PLACEMENT (IOC);  Surgeon: Birder Robson, MD;  Location: [facility] ORS;  Service: Ophthalmology;  Laterality: Right;  [place] 00:32 AP% 17.2 CDE 5.57 Fluid pack lot # [id number]  . CATARACT EXTRACTION W/PHACO Left 04/22/2017   Procedure: CATARACT EXTRACTION PHACO AND INTRAOCULAR LENS PLACEMENT ([place]);  Surgeon: Birder Robson, MD;  Location: [facility] ORS;  Service: Ophthalmology;  Laterality: Left;  [place]  00:36 AP% 13.6 CDE 4.95 Fluid pack lot # [id number]  . COLONOSCOPY  May 2008   Dr. Jamal Collin  . TUBAL LIGATION  1980   Family History  Problem Relation Age of Onset  . Breast cancer Mother 63  . Diabetes Father   . Hypertension Sister   . Colon cancer Neg Hx   . Ovarian cancer Neg Hx   . Cervical cancer Neg Hx    Social History    Tobacco Use  . Smoking status: Never Smoker  . Smokeless tobacco: Never Used  Substance Use Topics  . Alcohol use: No  . Drug use: No     Office Visit from 03/27/2018 in [facility]  AUDIT-C Score  0      Interim medical history since last visit reviewed. Allergies and medications reviewed  Review of Systems Per HPI unless specifically indicated above     Objective:    BP 108/66   Pulse 70   Temp 98.4 F (36.9 C) (Oral)   Ht 5\' 4"  (1.626 m)   Wt 167 lb (75.8 kg)   SpO2 98%   BMI 28.67 kg/m   Wt Readings from Last 3 Encounters:  04/06/18 169 lb 12.8 oz (77 kg)  03/27/18 167 lb (75.8 kg)  10/24/17 168 lb (76.2 kg)    Physical Exam  Constitutional: She appears well-developed and well-nourished. No distress.  HENT:  Head: Normocephalic and atraumatic.  Eyes: EOM are normal. No scleral icterus.  Neck: No thyromegaly present.  Cardiovascular: Normal rate, regular rhythm and normal heart sounds.  No murmur heard. Pulmonary/Chest: Effort normal and breath sounds normal. No respiratory distress. She has no wheezes.  Abdominal: Soft. Bowel sounds are normal. She exhibits no distension.  Musculoskeletal: She exhibits no edema.  Neurological: She is alert.  Skin: Skin is warm and dry. She is not diaphoretic. No pallor.  Psychiatric: She has a normal mood and affect. Her behavior is normal. Judgment and thought content normal.    Results for orders placed or performed in visit on 10/24/17  Lipid panel  Result Value Ref Range   Cholesterol, Total 236 (H) 100 - 199 mg/dL   Triglycerides 130 0 - 149 mg/dL   HDL 67 >39 mg/dL   VLDL Cholesterol Cal 26 5 - 40 mg/dL   LDL Calculated 143 (H) 0 - 99 mg/dL   Chol/HDL Ratio 3.5 0.0 - 4.4 ratio  Comprehensive metabolic panel  Result Value Ref Range   Glucose 91 65 - 99 mg/dL   BUN 20 8 - 27 mg/dL   Creatinine, Ser 0.98 0.57 - 1.00 mg/dL   GFR calc non Af Amer 58 (L) >59 mL/min/1.73   GFR calc Af Amer 67  >59 mL/min/1.73   BUN/Creatinine Ratio 20 12 - 28   Sodium 144 134 - 144 mmol/L   Potassium  4.4 3.5 - 5.2 mmol/L   Chloride 106 96 - 106 mmol/L   CO2 23 20 - 29 mmol/L   Calcium 9.4 8.7 - 10.3 mg/dL   Total Protein 6.6 6.0 - 8.5 g/dL   Albumin 4.2 3.5 - 4.8 g/dL   Globulin, Total 2.4 1.5 - 4.5 g/dL   Albumin/Globulin Ratio 1.8 1.2 - 2.2   Bilirubin Total 0.2 0.0 - 1.2 mg/dL   Alkaline Phosphatase 82 39 - 117 IU/L   AST 20 0 - 40 IU/L   ALT 12 0 - 32 IU/L  CBC  Result Value Ref Range   WBC 3.6 3.4 - 10.8 x10E3/uL   RBC 4.95 3.77 - 5.28 x10E6/uL   Hemoglobin 13.1 11.1 - 15.9 g/dL   Hematocrit 41.9 34.0 - 46.6 %   MCV 85 79 - 97 fL   MCH 26.5 (L) 26.6 - 33.0 pg   MCHC 31.3 (L) 31.5 - 35.7 g/dL   RDW 14.6 12.3 - 15.4 %   Platelets 217 150 - 450 x10E3/uL  Hemoglobin A1c  Result Value Ref Range   Hgb A1c MFr Bld 5.8 (H) 4.8 - 5.6 %   Est. average glucose Bld gHb Est-mCnc 120 mg/dL      Assessment & Plan:   Problem List Items Addressed This Visit      Respiratory   Allergic rhinitis    Avoid decongestants; use medicine during appropriate season        Musculoskeletal and Integument   Arthritis of knee, degenerative    Managed by orthopaedist        Other   Pre-diabetes (Chronic)    Reviewed last A1c; encouraged healthy eating, weight management; check A1c in December      Relevant Orders   Hemoglobin A1c   Medication monitoring encounter    Check sgpt, Cr      Relevant Orders   COMPLETE METABOLIC PANEL WITH GFR   Hyperlipidemia (Chronic)    Reviewed last panel; switch statin; check lipids after 6 weeks of new therapy; limit saturated fats      Relevant Medications   atorvastatin (LIPITOR) 10 MG tablet   Other Relevant Orders   Lipid panel   Anxiety and depression    I am not planning to refill her benzodiazepine; explained this is not recommended for individuals over the age of 58; suggested she wean herself off of this; continue the SSRI; counseling  would likely be helpful; see AVS      Relevant Medications   escitalopram (LEXAPRO) 10 MG tablet       Follow up plan: Return in about 7 weeks (around 05/15/2018) for labs only; 6 months with Dr. Sanda Klein.  An after-visit summary was printed and given to the patient at [place].  Please see the patient instructions which may contain other information and recommendations beyond what is mentioned above in the assessment and plan.  Meds ordered this encounter  Medications  . atorvastatin (LIPITOR) 10 MG tablet    Sig: Take 1 tablet (10 mg total) by mouth at bedtime. For cholesterol; STOP pravastatin    Dispense:  30 tablet    Refill:  1  . escitalopram (LEXAPRO) 10 MG tablet    Sig: Take 0.5 tablets (5 mg total) by mouth daily.    Dispense:  15 tablet    Refill:  2    Orders Placed This Encounter  Procedures  . COMPLETE METABOLIC PANEL WITH GFR  . Lipid panel  . Hemoglobin A1c

## 2018-03-27 NOTE — Patient Instructions (Addendum)
Please do contact Dr. Bary Castilla about getting additional breast imaging now  STOP the pravastatin Start the new cholesterol medicine called atorvastation Return around December 20th for recheck of labs  I recommend that you stop the lorazepam and destroy them safely Start taking the escitalopram just 5 mg daily (half of a pill) to help with stress if needed The best way to deal with stress is mindfulness and breathing exercises and counseling, etc.  12 Ways to Curb Anxiety  ?Anxiety is normal human sensation. It is what helped our ancestors survive the pitfalls of the wilderness. Anxiety is defined as experiencing worry or nervousness about an imminent event or something with an uncertain outcome. It is a feeling experienced by most people at some point in their lives. Anxiety can be triggered by a very personal issue, such as the illness of a loved one, or an event of global proportions, such as a refugee crisis. Some of the symptoms of anxiety are:  Feeling restless.  Having a feeling of impending danger.  Increased heart rate.  Rapid breathing. Sweating.  Shaking.  Weakness or feeling tired.  Difficulty concentrating on anything except the current worry.  Insomnia.  Stomach or bowel problems. What can we do about anxiety we may be feeling? There are many techniques to help manage stress and relax. Here are 12 ways you can reduce your anxiety almost immediately: 1. Turn off the constant feed of information. Take a social media sabbatical. Studies have shown that social media directly contributes to social anxiety.  2. Monitor your television viewing habits. Are you watching shows that are also contributing to your anxiety, such as 24-hour news stations? Try watching something else, or better yet, nothing at all. Instead, listen to music, read an inspirational book or practice a hobby. 3. Eat nutritious meals. Also, don't skip meals and keep healthful snacks on hand. Hunger and poor diet  contributes to feeling anxious. 4. Sleep. Sleeping on a regular schedule for at least seven to eight hours a night will do wonders for your outlook when you are awake. 5. Exercise. Regular exercise will help rid your body of that anxious energy and help you get more restful sleep. 6. Try deep (diaphragmatic) breathing. Inhale slowly through your nose for five seconds and exhale through your mouth. 7. Practice acceptance and gratitude. When anxiety hits, accept that there are things out of your control that shouldn't be of immediate concern.  8. Seek out humor. When anxiety strikes, watch a funny video, read jokes or call a friend who makes you laugh. Laughter is healing for our bodies and releases endorphins that are calming. 9. Stay positive. Take the effort to replace negative thoughts with positive ones. Try to see a stressful situation in a positive light. Try to come up with solutions rather than dwelling on the problem. 10. Figure out what triggers your anxiety. Keep a journal and make note of anxious moments and the events surrounding them. This will help you identify triggers you can avoid or even eliminate. 11. Talk to someone. Let a trusted friend, family member or even trained professional know that you are feeling overwhelmed and anxious. Verbalize what you are feeling and why.  12. Volunteer. If your anxiety is triggered by a crisis on a large scale, become an advocate and work to resolve the problem that is causing you unease. Anxiety is often unwelcome and can become overwhelming. If not kept in check, it can become a disorder that could require medical treatment.  However, if you take the time to care for yourself and avoid the triggers that make you anxious, you will be able to find moments of relaxation and clarity that make your life much more enjoyable.  Try to drink at least 8 glasses of water a day and get 25 or more grams of fiber to help keep stools regular If you need something,  Miralax is available over-the-counter   Constipation, Adult Constipation is when a person has fewer bowel movements in a week than normal, has difficulty having a bowel movement, or has stools that are dry, hard, or larger than normal. Constipation may be caused by an underlying condition. It may become worse with age if a person takes certain medicines and does not take in enough fluids. Follow these instructions at home: Eating and drinking   Eat foods that have a lot of fiber, such as fresh fruits and vegetables, whole grains, and beans.  Limit foods that are high in fat, low in fiber, or overly processed, such as french fries, hamburgers, cookies, candies, and soda.  Drink enough fluid to keep your urine clear or pale yellow. General instructions  Exercise regularly or as told by your health care provider.  Go to the restroom when you have the urge to go. Do not hold it in.  Take over-the-counter and prescription medicines only as told by your health care provider. These include any fiber supplements.  Practice pelvic floor retraining exercises, such as deep breathing while relaxing the lower abdomen and pelvic floor relaxation during bowel movements.  Watch your condition for any changes.  Keep all follow-up visits as told by your health care provider. This is important. Contact a health care provider if:  You have pain that gets worse.  You have a fever.  You do not have a bowel movement after 4 days.  You vomit.  You are not hungry.  You lose weight.  You are bleeding from the anus.  You have thin, pencil-like stools. Get help right away if:  You have a fever and your symptoms suddenly get worse.  You leak stool or have blood in your stool.  Your abdomen is bloated.  You have severe pain in your abdomen.  You feel dizzy or you faint. This information is not intended to replace advice given to you by your health care provider. Make sure you discuss any  questions you have with your health care provider. Document Released: 02/02/2004 Document Revised: 11/24/2015 Document Reviewed: 10/25/2015 Elsevier Interactive Patient Education  2018 [employer].

## 2018-04-06 ENCOUNTER — Ambulatory Visit (INDEPENDENT_AMBULATORY_CARE_PROVIDER_SITE_OTHER): Payer: Medicare Other | Admitting: Family Medicine

## 2018-04-06 ENCOUNTER — Encounter: Payer: Self-pay | Admitting: Family Medicine

## 2018-04-06 VITALS — BP 116/70 | HR 61 | Temp 99.2°F | Ht 64.0 in | Wt 169.8 lb

## 2018-04-06 DIAGNOSIS — Z111 Encounter for screening for respiratory tuberculosis: Secondary | ICD-10-CM

## 2018-04-06 DIAGNOSIS — Z0289 Encounter for other administrative examinations: Secondary | ICD-10-CM

## 2018-04-06 NOTE — Progress Notes (Signed)
BP 116/70   Pulse 61   Temp 99.2 F (37.3 C)   Ht 5\' 4"  (1.626 m)   Wt 169 lb 12.8 oz (77 kg)   SpO2 97%   BMI 29.15 kg/m    Subjective:    Patient ID: Sonya Wade, female    DOB: November 09, 1945, 72 y.o.   MRN: [id number]  HPI: Sonya Wade is a 72 y.o. female  Chief Complaint  Patient presents with  . 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 No problems with vision or hearing  No back problems No exposure to TB Has not had Hep B series All other vaccines UTD See form that had to be completed today  Depression screen [facility] 2/9 04/06/2018 03/27/2018 09/24/2017 07/16/2017 06/03/2016  Decreased Interest 0 0 0 0 0  Down, Depressed, Hopeless 0 0 0 0 0  PHQ - 2 Score 0 0 0 0 0  Altered sleeping 0 0 - - -  Tired, decreased energy 0 0 - - -  Change in appetite 0 0 - - -  Feeling bad or failure about yourself  0 0 - - -  Trouble concentrating 0 0 - - -  Moving slowly or fidgety/restless 0 0 - - -  Suicidal thoughts 0 0 - - -  PHQ-9 Score 0 0 - - -  Difficult doing work/chores Not difficult at all Not difficult at all - - -   Fall Risk  04/06/2018 03/27/2018 09/24/2017 07/16/2017 06/03/2016  Falls in the past year? 0 0 No No No  Number falls in past yr: - 0 - - -  Injury with Fall? - 0 - - -  Follow up - - - - -    Relevant past medical, surgical, family and social history reviewed Past Medical History:  Diagnosis Date  . Arthritis   . Benign neoplasm of breast 2011  . Diffuse cystic mastopathy   . Family history of malignant neoplasm of breast   . Hyperlipidemia   . Lump or mass in breast    Past Surgical History:  Procedure Laterality Date  . BREAST BIOPSY Left 2013   core, apocine cyst. S marker  . BREAST BIOPSY Left 02/04/2017   Dr. Jamal Collin BX of 10:00 mass. R marker. INFLAMED BREAST PARENCHYMA WITH FIBROSIS  . BREAST CYST ASPIRATION Left 08/13/2016   FNA of 10:00 cyst done by DR. Sankar  . BREAST EXCISIONAL BIOPSY  Left 2005   intraductal papilloma  . CATARACT EXTRACTION W/PHACO Right 04/02/2017   Procedure: CATARACT EXTRACTION PHACO AND INTRAOCULAR LENS PLACEMENT (IOC);  Surgeon: Birder Robson, MD;  Location: [facility] ORS;  Service: Ophthalmology;  Laterality: Right;  [place] 00:32 AP% 17.2 CDE 5.57 Fluid pack lot # [id number]  . CATARACT EXTRACTION W/PHACO Left 04/22/2017   Procedure: CATARACT EXTRACTION PHACO AND INTRAOCULAR LENS PLACEMENT ([place]);  Surgeon: Birder Robson, MD;  Location: [facility] ORS;  Service: Ophthalmology;  Laterality: Left;  [place]  00:36 AP% 13.6 CDE 4.95 Fluid pack lot # [id number]  . COLONOSCOPY  May 2008   Dr. Jamal Collin  . TUBAL LIGATION  1980   Family History  Problem Relation Age of Onset  . Breast cancer Mother 51  . Diabetes Father   . Hypertension Sister   . Colon cancer Neg Hx   . Ovarian cancer Neg Hx   . Cervical cancer Neg Hx    Social History   Tobacco Use  . Smoking status:  Never Smoker  . Smokeless tobacco: Never Used  Substance Use Topics  . Alcohol use: No  . Drug use: No     [facility] Visit from 04/06/2018 in [facility]  AUDIT-C Score  0      Interim medical history since last visit reviewed. Allergies and medications reviewed  Review of Systems Per HPI unless specifically indicated above     Objective:    BP 116/70   Pulse 61   Temp 99.2 F (37.3 C)   Ht 5\' 4"  (1.626 m)   Wt 169 lb 12.8 oz (77 kg)   SpO2 97%   BMI 29.15 kg/m   Wt Readings from Last 3 Encounters:  04/06/18 169 lb 12.8 oz (77 kg)  03/27/18 167 lb (75.8 kg)  10/24/17 168 lb (76.2 kg)    Physical Exam  Constitutional: She appears well-developed and well-nourished.  HENT:  Mouth/Throat: Mucous membranes are normal.  Eyes: EOM are normal. No scleral icterus.  Cardiovascular: Normal rate and regular rhythm.  Pulmonary/Chest: Effort normal and breath sounds normal.  Psychiatric: She has a normal mood and affect. Her behavior is normal.      Assessment &  Plan:   Problem List Items Addressed This Visit    None    Visit Diagnoses    Encounter for completion of form with patient    -  Primary   see form; no limitations or restrictions to working in school system; will check for TB prior to finishing and sending in the form   Tuberculosis screening       Relevant Orders   QuantiFERON-TB Gold Plus (Completed)       Follow up plan: No follow-ups on file.  An after-visit summary was printed and given to the patient at [place].  Please see the patient instructions which may contain other information and recommendations beyond what is mentioned above in the assessment and plan.  No orders of the defined types were placed in this encounter.   Orders Placed This Encounter  Procedures  . QuantiFERON-TB Gold Plus

## 2018-04-06 NOTE — Patient Instructions (Signed)
Please check with the school system to see if they want you to have the hepatitis B series You can get that here or the health department if needed We'll get the labs today If you have not heard anything from my staff in a week about any orders/referrals/studies from today, please contact us here to follow-up [phone]

## 2018-04-08 LAB — QUANTIFERON-TB GOLD PLUS
Mitogen-NIL: >10 IU/mL
NIL: 0.02 IU/mL
QUANTIFERON-TB GOLD PLUS: NEGATIVE
TB1-NIL: 0 [IU]/mL
TB2-NIL: 0 IU/mL

## 2018-04-15 DIAGNOSIS — Z5181 Encounter for therapeutic drug level monitoring: Secondary | ICD-10-CM

## 2018-04-15 NOTE — Assessment & Plan Note (Signed)
I am not planning to refill her benzodiazepine; explained this is not recommended for individuals over the age of 34; suggested she wean herself off of this; continue the SSRI; counseling would likely be helpful; see AVS

## 2018-04-15 NOTE — Assessment & Plan Note (Addendum)
Reviewed last panel; switch statin; check lipids after 6 weeks of new therapy; limit saturated fats

## 2018-04-15 NOTE — Assessment & Plan Note (Signed)
Check sgpt, Cr 

## 2018-04-15 NOTE — Assessment & Plan Note (Signed)
Avoid decongestants; use medicine during appropriate season

## 2018-04-15 NOTE — Assessment & Plan Note (Signed)
Reviewed last A1c; encouraged healthy eating, weight management; check A1c in December

## 2018-04-15 NOTE — Assessment & Plan Note (Signed)
Managed by orthopaedist

## 2018-04-20 ENCOUNTER — Ambulatory Visit: Payer: Self-pay | Admitting: Family Medicine

## 2018-04-24 ENCOUNTER — Other Ambulatory Visit: Payer: Self-pay | Admitting: Family Medicine

## 2018-05-08 ENCOUNTER — Other Ambulatory Visit: Payer: Self-pay | Admitting: Family Medicine

## 2018-05-08 DIAGNOSIS — K219 Gastro-esophageal reflux disease without esophagitis: Secondary | ICD-10-CM

## 2018-06-02 ENCOUNTER — Other Ambulatory Visit: Payer: Self-pay | Admitting: Family Medicine

## 2018-06-02 NOTE — Telephone Encounter (Signed)
Please remind patient of labs that we had hoped to get the last week of December Ask her to please have those done soon Thank you

## 2018-06-02 NOTE — Telephone Encounter (Signed)
Left detailed voicemail

## 2018-06-15 ENCOUNTER — Other Ambulatory Visit: Payer: Self-pay | Admitting: Family Medicine

## 2018-06-15 DIAGNOSIS — M1710 Unilateral primary osteoarthritis, unspecified knee: Secondary | ICD-10-CM

## 2018-06-15 DIAGNOSIS — M179 Osteoarthritis of knee, unspecified: Secondary | ICD-10-CM

## 2018-06-15 NOTE — Telephone Encounter (Signed)
See Rx request. I believe she is your patient now.

## 2018-06-15 NOTE — Telephone Encounter (Signed)
Please remind patient that she is overdue for labs We had hoped to get labs at the end of December Please come in for those as soon as convenient Also, I would not recommend that she take meloxicam much at all; tylenol would be better The meloxicam can damage kidneys over time and can increase the risk of GI bleeding, stomach ulcers, etc.

## 2018-06-16 NOTE — Telephone Encounter (Signed)
Pt.notified

## 2018-06-18 ENCOUNTER — Other Ambulatory Visit: Payer: Self-pay

## 2018-06-18 DIAGNOSIS — Z5181 Encounter for therapeutic drug level monitoring: Secondary | ICD-10-CM | POA: Diagnosis not present

## 2018-06-18 DIAGNOSIS — R7303 Prediabetes: Secondary | ICD-10-CM | POA: Diagnosis not present

## 2018-06-18 DIAGNOSIS — E782 Mixed hyperlipidemia: Secondary | ICD-10-CM | POA: Diagnosis not present

## 2018-06-19 LAB — LIPID PANEL
Cholesterol: 172 mg/dL (ref <200)
HDL: 65 mg/dL (ref >50)
LDL CHOLESTEROL (CALC): 84 mg/dL
NON-HDL CHOLESTEROL (CALC): 107 mg/dL (ref <130)
Total CHOL/HDL Ratio: 2.6 (calc) (ref <5.0)
Triglycerides: 125 mg/dL (ref <150)

## 2018-06-19 LAB — COMPLETE METABOLIC PANEL WITH GFR
AG Ratio: 1.4 (calc) (ref 1.0–2.5)
ALBUMIN MSPROF: 4.3 g/dL (ref 3.6–5.1)
ALKALINE PHOSPHATASE (APISO): 72 U/L (ref 33–130)
ALT: 11 U/L (ref 6–29)
AST: 16 U/L (ref 10–35)
BILIRUBIN TOTAL: 0.4 mg/dL (ref 0.2–1.2)
BUN: 20 mg/dL (ref 7–25)
CO2: 29 mmol/L (ref 20–32)
CREATININE: 0.9 mg/dL (ref 0.60–0.93)
Calcium: 9.8 mg/dL (ref 8.6–10.4)
Chloride: 105 mmol/L (ref 98–110)
GFR, Est African American: 75 mL/min/{1.73_m2} (ref >=60)
GFR, Est Non African American: 64 mL/min/{1.73_m2} (ref >=60)
GLUCOSE: 88 mg/dL (ref 65–99)
Globulin: 3 g/dL (calc) (ref 1.9–3.7)
Potassium: 4.6 mmol/L (ref 3.5–5.3)
Sodium: 143 mmol/L (ref 135–146)
Total Protein: 7.3 g/dL (ref 6.1–8.1)

## 2018-06-19 LAB — HEMOGLOBIN A1C
EAG (MMOL/L): 7 (calc)
Hgb A1c MFr Bld: 6 % of total Hgb — ABNORMAL HIGH (ref <5.7)
MEAN PLASMA GLUCOSE: 126 (calc)

## 2018-06-22 ENCOUNTER — Other Ambulatory Visit: Payer: Self-pay | Admitting: Family Medicine

## 2018-06-22 DIAGNOSIS — E782 Mixed hyperlipidemia: Secondary | ICD-10-CM

## 2018-06-22 MED ORDER — ATORVASTATIN CALCIUM 20 MG PO TABS: 20.0000 mg | ORAL_TABLET | Freq: Every day | ORAL | 1 refills | Status: DC

## 2018-06-22 NOTE — Telephone Encounter (Signed)
The 10-year ASCVD risk score Sonya Wade DC Brooke Bonito., et al., 2013) is: 9.5%   Values used to calculate the score:     Age: 73 years     Sex: Female     Is Non-Hispanic African American: Yes     Diabetic: No     Tobacco smoker: No     Systolic Blood Pressure: 374 mmHg     Is BP treated: No     HDL Cholesterol: 65 mg/dL     Total Cholesterol: 172 mg/dL  Lab Results  Component Value Date   CHOL 172 06/18/2018   HDL 65 06/18/2018   LDLCALC 84 06/18/2018   TRIG 125 06/18/2018   CHOLHDL 2.6 06/18/2018    Please thank patient for having her labs done I'd like to increase her atorvastatin from 10 mg to 20 mg and recheck her lipids in 6-8 weeks (I'll order) Try to limit saturated fats Thank you

## 2018-06-22 NOTE — Telephone Encounter (Signed)
Left detailed voicemail

## 2018-07-16 ENCOUNTER — Other Ambulatory Visit: Payer: Self-pay | Admitting: Family Medicine

## 2018-07-16 NOTE — Telephone Encounter (Signed)
Dose of statin adjusted recently Labs due in March I just sent a 2 month supply of statin on 06/22/2018 which should last until early April Rx request denied Please resolve with pharmacy, as she shouldn't be out already

## 2018-07-17 NOTE — Telephone Encounter (Signed)
Resolved with pharmacy

## 2018-08-18 ENCOUNTER — Other Ambulatory Visit: Payer: Self-pay | Admitting: Family Medicine

## 2018-08-18 NOTE — Telephone Encounter (Signed)
Due to the COVID-19 pandemic, I am not going to ask the patient to come to the office for a blood draw right now; refill sent for the statin and will reconsider getting labs when safer in the near future

## 2018-09-25 ENCOUNTER — Ambulatory Visit: Payer: Medicare Other | Admitting: Family Medicine

## 2018-09-28 ENCOUNTER — Ambulatory Visit: Payer: Self-pay

## 2018-10-20 DIAGNOSIS — Z1231 Encounter for screening mammogram for malignant neoplasm of breast: Secondary | ICD-10-CM | POA: Diagnosis not present

## 2018-10-20 DIAGNOSIS — Z9289 Personal history of other medical treatment: Secondary | ICD-10-CM | POA: Diagnosis not present

## 2018-10-23 ENCOUNTER — Encounter: Payer: Self-pay | Admitting: General Surgery

## 2018-10-27 ENCOUNTER — Encounter: Payer: Self-pay | Admitting: General Surgery

## 2018-10-27 ENCOUNTER — Ambulatory Visit (INDEPENDENT_AMBULATORY_CARE_PROVIDER_SITE_OTHER): Payer: Medicare Other | Admitting: General Surgery

## 2018-10-27 ENCOUNTER — Other Ambulatory Visit: Payer: Self-pay

## 2018-10-27 VITALS — BP 144/85 | HR 63 | Temp 97.5°F | Resp 14 | Wt 175.0 lb

## 2018-10-27 DIAGNOSIS — R42 Dizziness and giddiness: Secondary | ICD-10-CM | POA: Diagnosis not present

## 2018-10-27 DIAGNOSIS — Z803 Family history of malignant neoplasm of breast: Secondary | ICD-10-CM

## 2018-10-27 NOTE — Progress Notes (Signed)
Patient ID: Sonya Wade, female   DOB: [birth year], 73 y.o.   MRN: [id number]  Chief Complaint  Patient presents with  . Follow-up    HPI Sonya Wade is a 73 y.o. female who presents for a breast evaluation. The most recent mammogram was done on 10/20/18.  Patient does perform regular self breast checks and gets regular mammograms done.  She has no new breast concerns. HPI  Past Medical History:  Diagnosis Date  . Arthritis   . Benign neoplasm of breast 2011  . Diffuse cystic mastopathy   . Family history of malignant neoplasm of breast   . Hyperlipidemia   . Lump or mass in breast     Past Surgical History:  Procedure Laterality Date  . BREAST BIOPSY Left 2013   core, apocine cyst. S marker  . BREAST BIOPSY Left 02/04/2017   Dr. Jamal Collin BX of 10:00 mass. R marker. INFLAMED BREAST PARENCHYMA WITH FIBROSIS  . BREAST CYST ASPIRATION Left 08/13/2016   FNA of 10:00 cyst done by DR. Sankar  . BREAST EXCISIONAL BIOPSY Left 2005   intraductal papilloma  . CATARACT EXTRACTION W/PHACO Right 04/02/2017   Procedure: CATARACT EXTRACTION PHACO AND INTRAOCULAR LENS PLACEMENT ([facility]);  Surgeon: Birder Robson, MD;  Location: [facility] ORS;  Service: Ophthalmology;  Laterality: Right;  [place] 00:32 AP% 17.2 CDE 5.57 Fluid pack lot # [id number]  . CATARACT EXTRACTION W/PHACO Left 04/22/2017   Procedure: CATARACT EXTRACTION PHACO AND INTRAOCULAR LENS PLACEMENT ([place]);  Surgeon: Birder Robson, MD;  Location: [facility] ORS;  Service: Ophthalmology;  Laterality: Left;  [place]  00:36 AP% 13.6 CDE 4.95 Fluid pack lot # [id number]  . COLONOSCOPY  May 2008   Dr. Jamal Collin  . TUBAL LIGATION  1980    Family History  Problem Relation Age of Onset  . Breast cancer Mother 52  . Diabetes Father   . Hypertension Sister   . Colon cancer Neg Hx   . Ovarian cancer Neg Hx   . Cervical cancer Neg Hx     Social History Social History   Tobacco Use  . Smoking status: Never Smoker  . Smokeless  tobacco: Never Used  Substance Use Topics  . Alcohol use: No  . Drug use: No    No Known Allergies  Current Outpatient Medications  Medication Sig Dispense Refill  . aspirin 81 MG tablet Take 81 mg by mouth daily.    Marland Kitchen atorvastatin (LIPITOR) 20 MG tablet TAKE 1 TABLET BY MOUTH EVERYDAY AT BEDTIME 90 tablet 0  . Cholecalciferol 1000 UNITS tablet Take 1,000 Units by mouth daily.     Marland Kitchen escitalopram (LEXAPRO) 10 MG tablet Take 0.5 tablets (5 mg total) by mouth daily. 15 tablet 2  . fluticasone (FLONASE) 50 MCG/ACT nasal spray Place 2 sprays into both nostrils daily. (Patient taking differently: Place 2 sprays into both nostrils daily. ) 16 g 6  . folic acid (FOLVITE) 885 MCG tablet Take 400 mcg daily by mouth.    . meloxicam (MOBIC) 15 MG tablet Take 1 tablet (15 mg total) by mouth daily as needed for pain. Caution: long-term use can damage kidneys 30 tablet 0  . Multiple Vitamin (MULTIVITAMIN) tablet Take 1 tablet by mouth daily.    . Omega-3 Fatty Acids (FISH OIL PO) Take 1,400 mg by mouth 2 (two) times daily.     Marland Kitchen omeprazole (PRILOSEC) 20 MG capsule TAKE 1 CAPSULE BY MOUTH EVERY DAY 90 capsule 1   No current facility-administered medications for this  visit.     Review of Systems Review of Systems  Constitutional: Negative.   Respiratory: Negative.   Cardiovascular: Negative.     Blood pressure (!) 144/85, pulse 63, temperature (!) 97.5 F (36.4 C), resp. rate 14, weight 175 lb (79.4 kg), SpO2 97 %.  Physical Exam Physical Exam Constitutional:      Appearance: She is well-developed.  HENT:     Head:      Right Ear: Tympanic membrane and ear canal normal.     Left Ear: Tympanic membrane and ear canal normal.  Eyes:     General: No scleral icterus.    Extraocular Movements:     Right eye: No nystagmus.     Left eye: No nystagmus.     Conjunctiva/sclera: Conjunctivae normal.  Neck:     Musculoskeletal: Neck supple.  Cardiovascular:     Rate and Rhythm: Normal rate and  regular rhythm.     Heart sounds: Normal heart sounds.  Pulmonary:     Effort: Pulmonary effort is normal.     Breath sounds: Normal breath sounds.  Chest:     Breasts:        Right: No inverted nipple, mass, nipple discharge, skin change or tenderness.        Left: No inverted nipple, mass, nipple discharge, skin change or tenderness.  Lymphadenopathy:     Cervical: No cervical adenopathy.     Upper Body:     Right upper body: No axillary adenopathy.     Left upper body: No axillary adenopathy.  Skin:    General: Skin is warm and dry.  Neurological:     Mental Status: She is alert and oriented to person, place, and time.     Cranial Nerves: Cranial nerves are intact.     Data Reviewed The October 20, 2018 mammogram report from [facility] was reviewed.  No interval change.  BI-RADS-2.   Assessment Stable breast exam.  New intermittent vertigo, likely middle ear source.  Plan Referral to [facility].  Follow up here in one year with screening mammogram.   HPI, Physical Exam, Assessment and Plan have been scribed under the direction and in the presence of Robert Bellow, MD  Concepcion Living, LPN  Patient will be referred to [facility] regarding dizziness. Their office will contact patient directly regarding an appointment.   Dominga Ferry, CMA   I have completed the exam and reviewed the above documentation for accuracy and completeness.  I agree with the above.  Haematologist has been used and any errors in dictation or transcription are unintentional.  Hervey Ard, M.D., F.A.C.S.  Forest Gleason Byrnett 10/28/2018, 7:52 AM

## 2018-10-27 NOTE — Patient Instructions (Signed)
We will refer you to [facility] for your dizzyness.  Follow up here in one year with screening mammogram.

## 2018-11-04 DIAGNOSIS — R42 Dizziness and giddiness: Secondary | ICD-10-CM | POA: Diagnosis not present

## 2018-11-04 DIAGNOSIS — H8112 Benign paroxysmal vertigo, left ear: Secondary | ICD-10-CM | POA: Diagnosis not present

## 2018-11-12 ENCOUNTER — Telehealth: Payer: Self-pay | Admitting: Family Medicine

## 2018-11-12 NOTE — Telephone Encounter (Signed)
Patient needs routine follow-up in the next month

## 2018-11-13 NOTE — Telephone Encounter (Signed)
lvm for pt to call and schedule an appt °

## 2018-11-26 IMAGING — MG MM DIGITAL SCREENING BILAT W/ CAD
4 series · 4 of 4 positions shown · non-contrast
Comparison: Previous exam(s).

CLINICAL DATA: Screening.

EXAM:
DIGITAL SCREENING BILATERAL MAMMOGRAM WITH CAD

[L MLO]
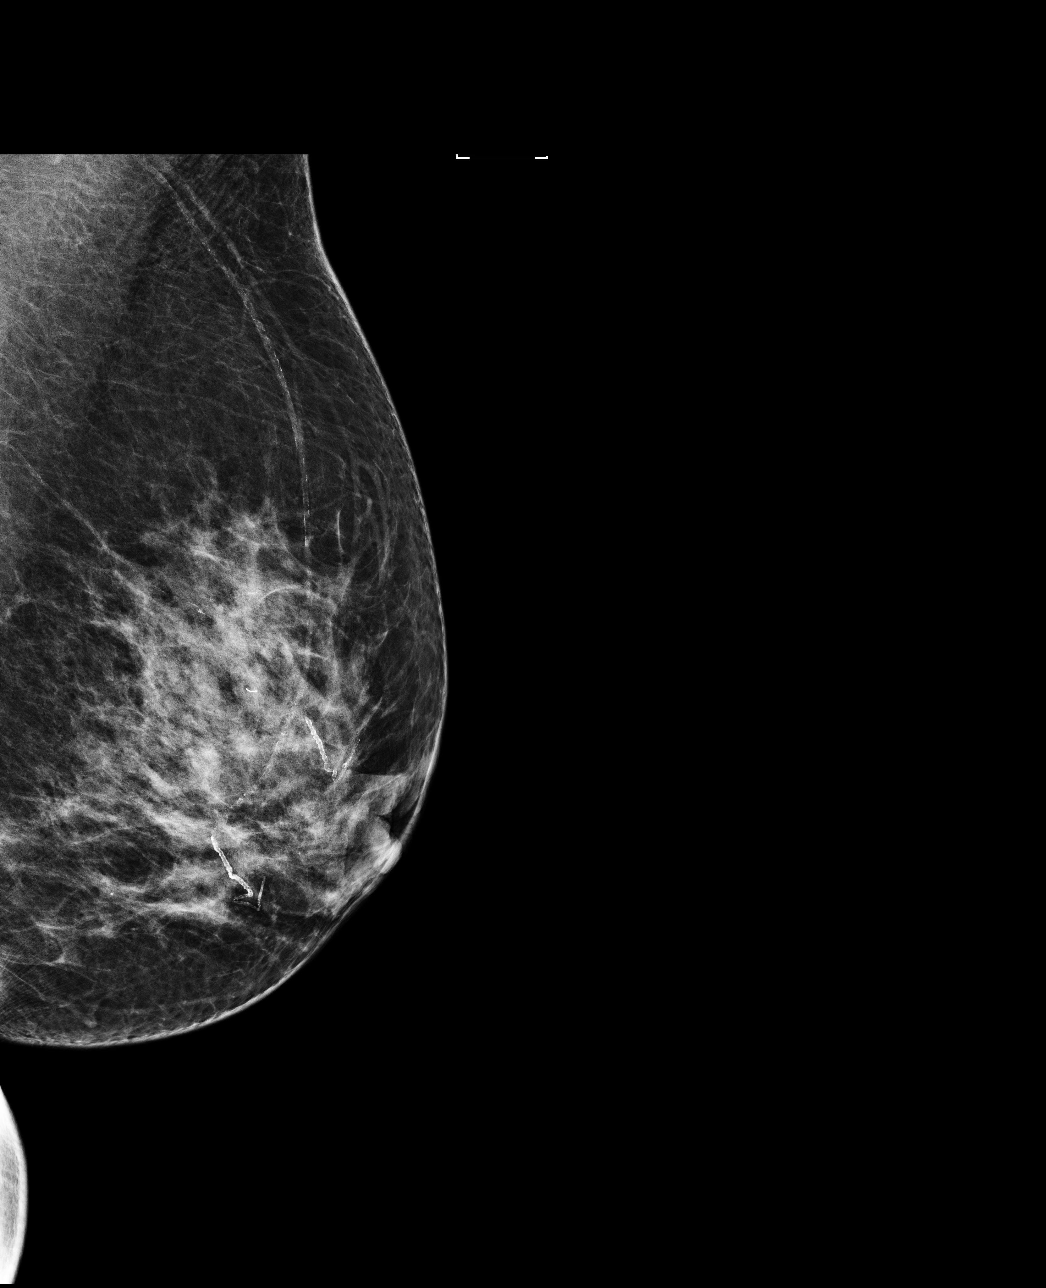

[L CC]
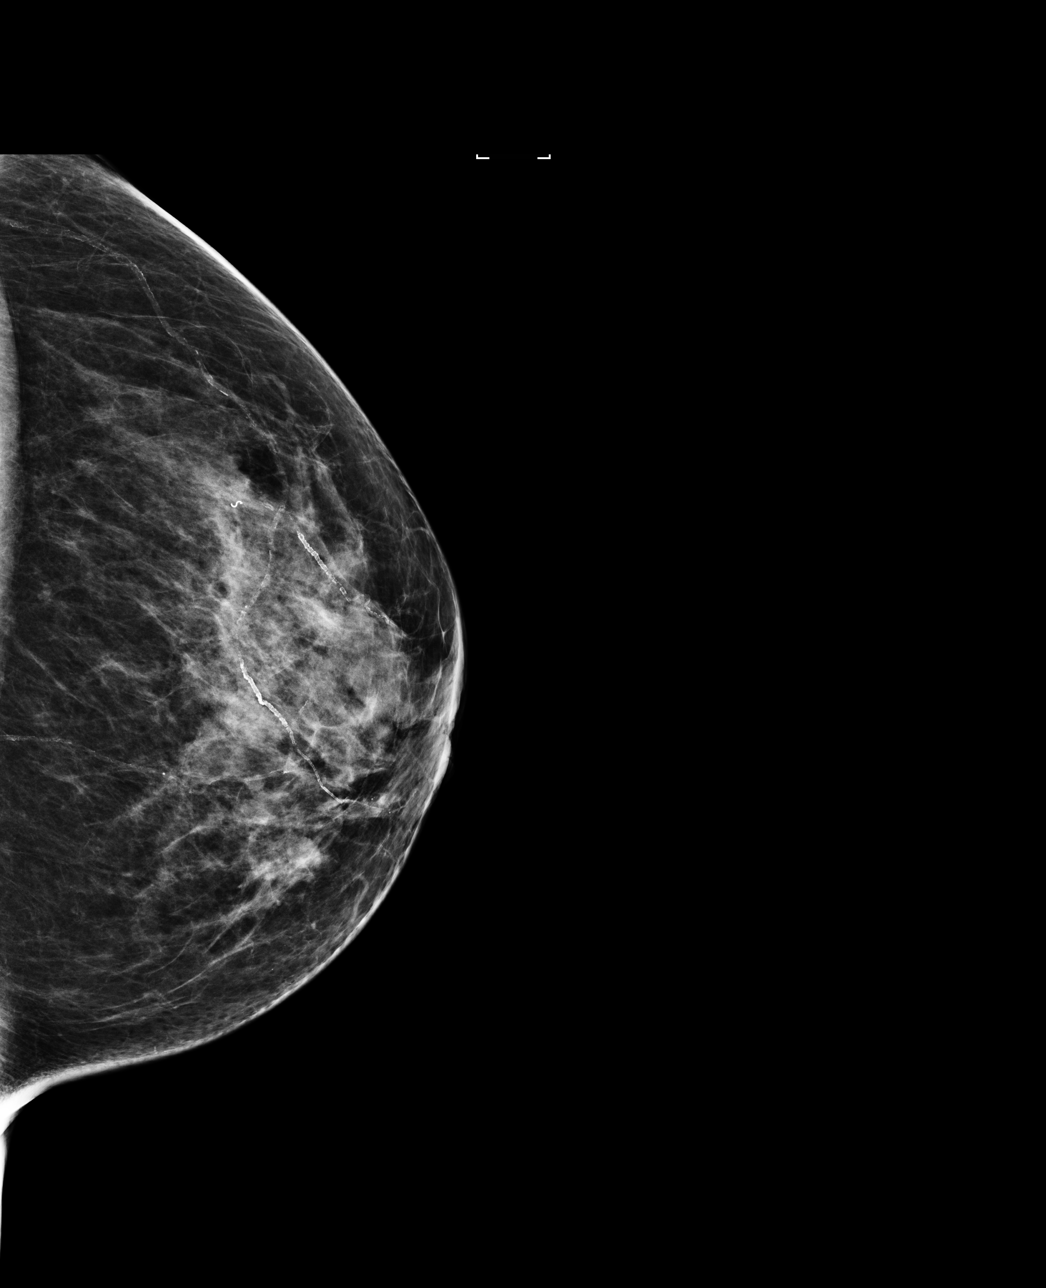

[R MLO]
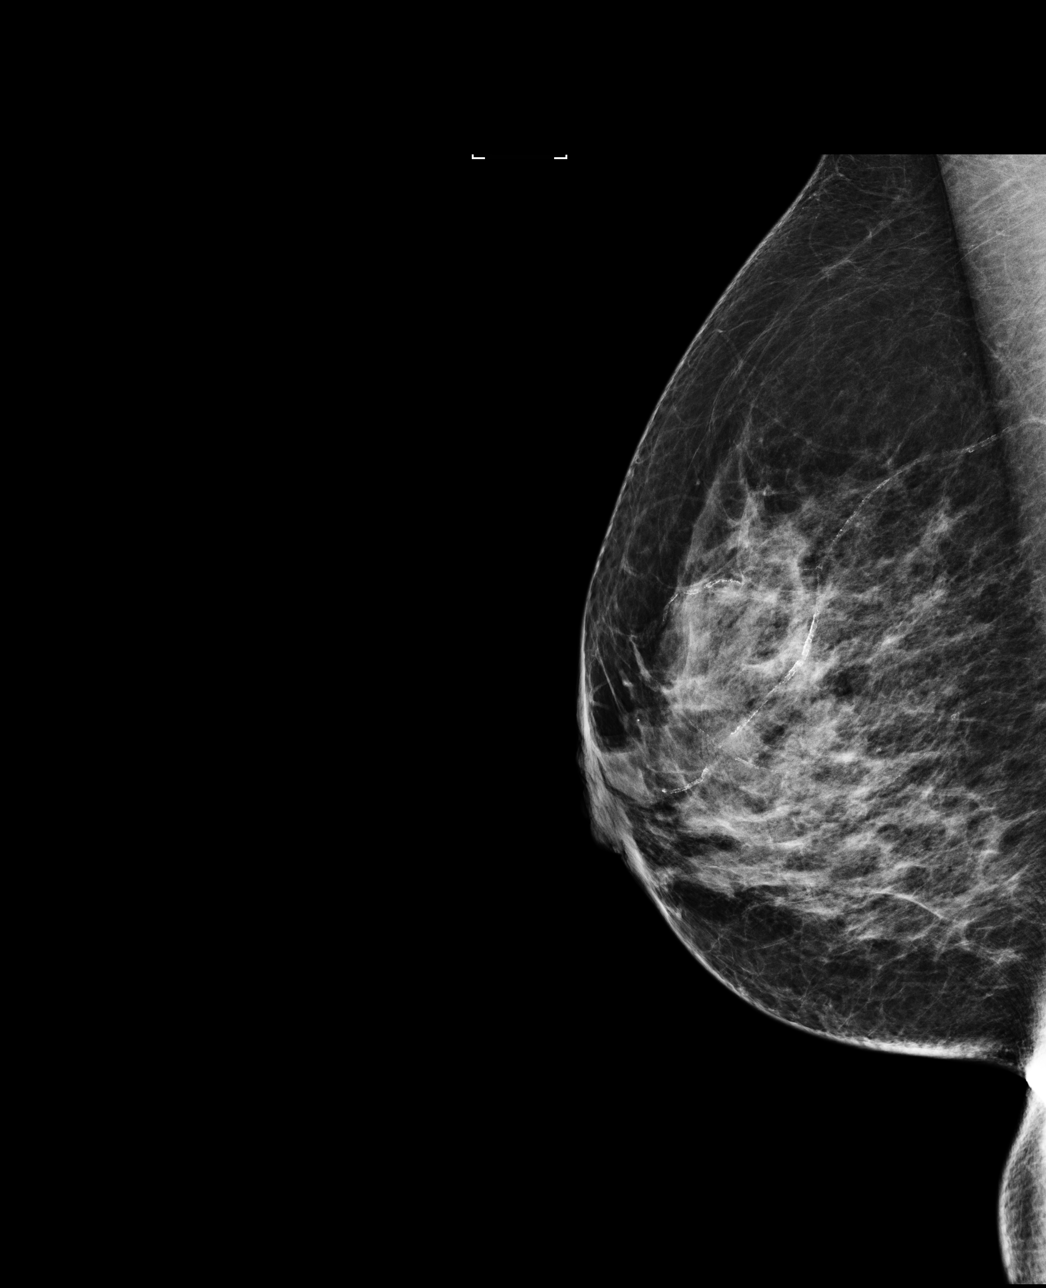

[R CC]
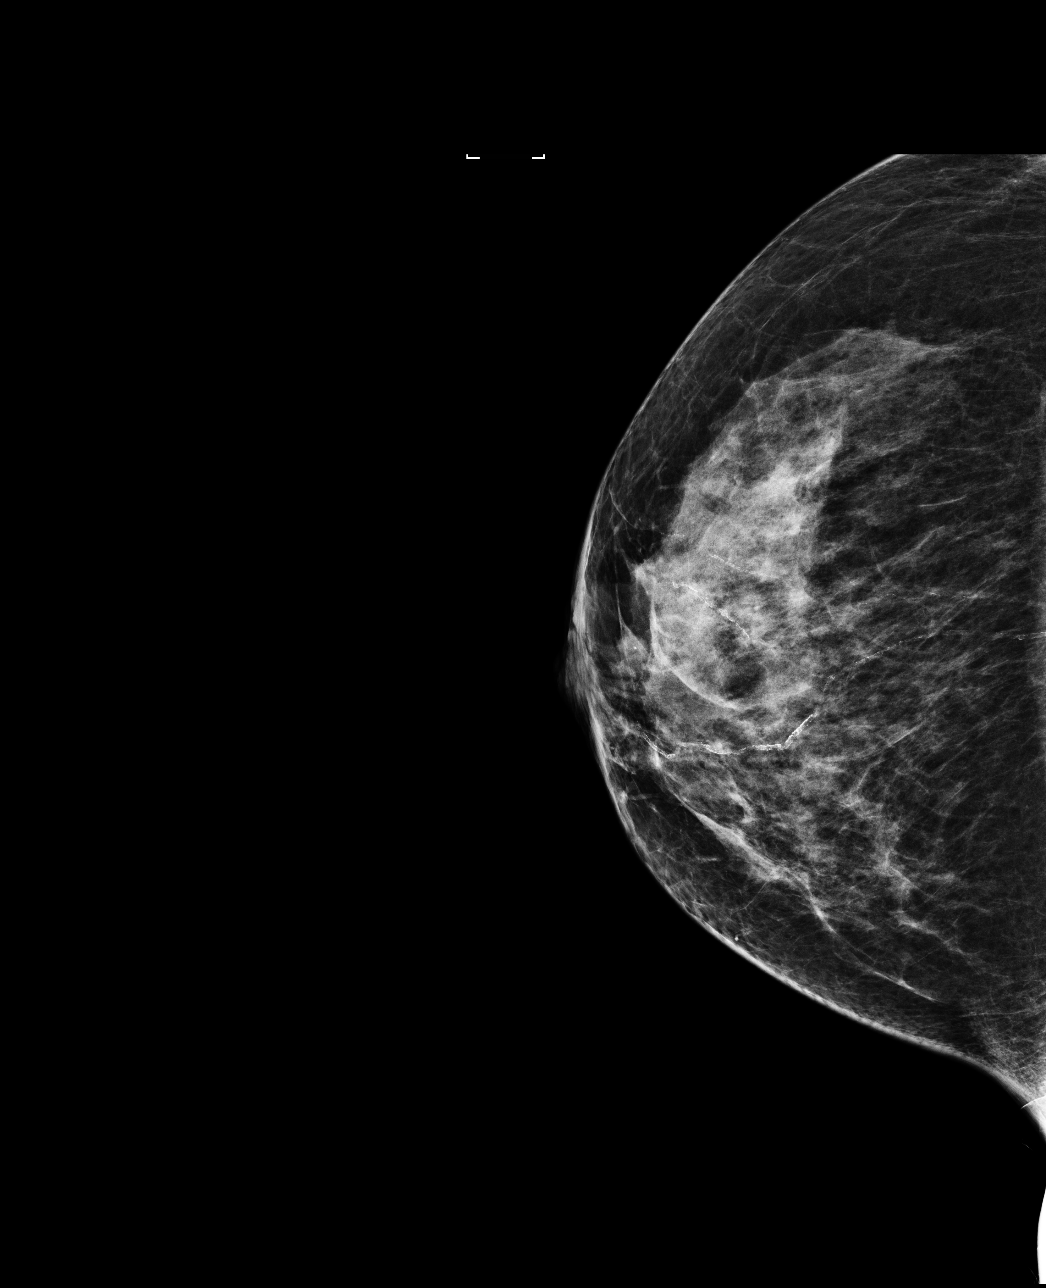

[4 of 4 positions shown; findings below may reference images not displayed]

ACR Breast Density Category c: The breast tissue is heterogeneously
dense, which may obscure small masses.
FINDINGS: In the left breast, a possible mass warrants further evaluation. In
the right breast, no findings suspicious for malignancy.

Images were processed with CAD.
IMPRESSION: Further evaluation is suggested for possible mass in the left
breast.

RECOMMENDATION:
Diagnostic mammogram and possibly ultrasound of the left breast.
(Code:[id number]-[id number])

The patient will be contacted regarding the findings, and additional
imaging will be scheduled.

BI-RADS CATEGORY  0: Incomplete. Need additional imaging evaluation
and/or prior mammograms for comparison.

## 2018-11-27 ENCOUNTER — Ambulatory Visit: Payer: Self-pay | Admitting: *Deleted

## 2018-11-27 DIAGNOSIS — H8309 Labyrinthitis, unspecified ear: Secondary | ICD-10-CM | POA: Diagnosis not present

## 2018-11-27 DIAGNOSIS — J019 Acute sinusitis, unspecified: Secondary | ICD-10-CM | POA: Diagnosis not present

## 2018-11-27 DIAGNOSIS — B9689 Other specified bacterial agents as the cause of diseases classified elsewhere: Secondary | ICD-10-CM | POA: Diagnosis not present

## 2018-11-27 DIAGNOSIS — J302 Other seasonal allergic rhinitis: Secondary | ICD-10-CM | POA: Diagnosis not present

## 2018-11-27 DIAGNOSIS — H66002 Acute suppurative otitis media without spontaneous rupture of ear drum, left ear: Secondary | ICD-10-CM | POA: Diagnosis not present

## 2018-11-27 NOTE — Telephone Encounter (Signed)
Summary: headache/vertigo   Pt states she is experiencing headache with dizziness upon laying down. Pt states she was seen by Dr Tami Ribas dx her with Vertigo, but she states she is not feeling any better and would like to speak with a nurse. Please call pt: [phone]

## 2018-11-30 NOTE — Telephone Encounter (Signed)
Pt states she went to walk in clinic and was dx with sinus infection.  She was given antibiotics and doing much better today.

## 2018-12-18 ENCOUNTER — Encounter: Payer: Self-pay | Admitting: General Surgery

## 2019-01-14 ENCOUNTER — Ambulatory Visit (INDEPENDENT_AMBULATORY_CARE_PROVIDER_SITE_OTHER): Payer: Medicare Other | Admitting: Family Medicine

## 2019-01-14 ENCOUNTER — Encounter: Payer: Self-pay | Admitting: Family Medicine

## 2019-01-14 ENCOUNTER — Other Ambulatory Visit: Payer: Self-pay

## 2019-01-14 ENCOUNTER — Ambulatory Visit: Payer: Medicare Other | Admitting: Nurse Practitioner

## 2019-01-14 VITALS — BP 134/86 | HR 62 | Temp 97.2°F | Resp 16 | Ht 64.0 in | Wt 177.7 lb

## 2019-01-14 DIAGNOSIS — F419 Anxiety disorder, unspecified: Secondary | ICD-10-CM | POA: Diagnosis not present

## 2019-01-14 DIAGNOSIS — Z7689 Persons encountering health services in other specified circumstances: Secondary | ICD-10-CM

## 2019-01-14 DIAGNOSIS — R7303 Prediabetes: Secondary | ICD-10-CM

## 2019-01-14 DIAGNOSIS — Z6830 Body mass index (BMI) 30.0-30.9, adult: Secondary | ICD-10-CM

## 2019-01-14 DIAGNOSIS — Z5181 Encounter for therapeutic drug level monitoring: Secondary | ICD-10-CM

## 2019-01-14 DIAGNOSIS — H6982 Other specified disorders of Eustachian tube, left ear: Secondary | ICD-10-CM

## 2019-01-14 DIAGNOSIS — E669 Obesity, unspecified: Secondary | ICD-10-CM | POA: Diagnosis not present

## 2019-01-14 DIAGNOSIS — K219 Gastro-esophageal reflux disease without esophagitis: Secondary | ICD-10-CM | POA: Diagnosis not present

## 2019-01-14 DIAGNOSIS — R600 Localized edema: Secondary | ICD-10-CM

## 2019-01-14 DIAGNOSIS — J309 Allergic rhinitis, unspecified: Secondary | ICD-10-CM | POA: Diagnosis not present

## 2019-01-14 DIAGNOSIS — F329 Major depressive disorder, single episode, unspecified: Secondary | ICD-10-CM

## 2019-01-14 DIAGNOSIS — E782 Mixed hyperlipidemia: Secondary | ICD-10-CM | POA: Diagnosis not present

## 2019-01-14 DIAGNOSIS — F32A Depression, unspecified: Secondary | ICD-10-CM

## 2019-01-14 MED ORDER — ESCITALOPRAM OXALATE 5 MG PO TABS: 5.0000 mg | ORAL_TABLET | Freq: Every day | ORAL | 1 refills | Status: AC

## 2019-01-14 MED ORDER — FLUTICASONE PROPIONATE 50 MCG/ACT NA SUSP: 2.0000 | Freq: Every day | NASAL | 5 refills | Status: AC

## 2019-01-14 MED ORDER — LORATADINE 10 MG PO TABS: 10.0000 mg | ORAL_TABLET | Freq: Every day | ORAL | 5 refills | Status: AC

## 2019-01-14 MED ORDER — ATORVASTATIN CALCIUM 20 MG PO TABS: 20.0000 mg | ORAL_TABLET | Freq: Every day | ORAL | 3 refills | Status: AC

## 2019-01-14 NOTE — Patient Instructions (Addendum)
Return for flu shot in October - call for that nurse visit  Cholesterol - keep taking the same medicine, and decrease saturated fat/trans fat in your diet  Nasal and ear symptoms - flonase and antihistamine daily  (may change zyrtec to allergra or claritin)  Moods/depression - decrease dose of lexapro and take 5 mg daily.  Let me know if you would like to wean off more than that.    Acid reflux - pepcid or zantac as needed, follow up or let me know if symptoms worsen or are daily  Pre-diabetes - we'll check level two times a year and monitor.  Continue healthy diet and exercise  Try some compression stockings, elevating legs, low salt diet, and walking and avoiding immobility   Peripheral Edema  Peripheral edema is swelling that is caused by a buildup of fluid. Peripheral edema most often affects the lower legs, ankles, and feet. It can also develop in the arms, hands, and face. The area of the body that has peripheral edema will look swollen. It may also feel heavy or warm. Your clothes may start to feel tight. Pressing on the area may make a temporary dent in your skin. You may not be able to move your swollen arm or leg as much as usual. There are many causes of peripheral edema. It can happen because of a complication of other conditions such as congestive heart failure, kidney disease, or a problem with your blood circulation. It also can be a side effect of certain medicines or because of an infection. It often happens to women during pregnancy. Sometimes, the cause is not known. Follow these instructions at home: Managing pain, stiffness, and swelling   Raise (elevate) your legs while you are sitting or lying down.  Move around often to prevent stiffness and to lessen swelling.  Do not sit or stand for long periods of time.  Wear support stockings as told by your health care provider. Medicines  Take over-the-counter and prescription medicines only as told by your health care  provider.  Your health care provider may prescribe medicine to help your body get rid of excess water (diuretic). General instructions  Pay attention to any changes in your symptoms.  Follow instructions from your health care provider about limiting salt (sodium) in your diet. Sometimes, eating less salt may reduce swelling.  Moisturize skin daily to help prevent skin from cracking and draining.  Keep all follow-up visits as told by your health care provider. This is important. Contact a health care provider if you have:  A fever.  Edema that starts suddenly or is getting worse, especially if you are pregnant or have a medical condition.  Swelling in only one leg.  Increased swelling, redness, or pain in one or both of your legs.  Drainage or sores at the area where you have edema. Get help right away if you:  Develop shortness of breath, especially when you are lying down.  Have pain in your chest or abdomen.  Feel weak.  Feel faint. Summary  Peripheral edema is swelling that is caused by a buildup of fluid. Peripheral edema most often affects the lower legs, ankles, and feet.  Move around often to prevent stiffness and to lessen swelling. Do not sit or stand for long periods of time.  Pay attention to any changes in your symptoms.  Contact a health care provider if you have edema that starts suddenly or is getting worse, especially if you are pregnant or have a  medical condition.  Get help right away if you develop shortness of breath, especially when lying down. This information is not intended to replace advice given to you by your health care provider. Make sure you discuss any questions you have with your health care provider. Document Released: 06/13/2004 Document Revised: 01/28/2018 Document Reviewed: 01/28/2018 Elsevier Patient Education  2020 [employer].

## 2019-01-14 NOTE — Assessment & Plan Note (Signed)
Improving, not taking PPI, only using prn tums and pepcid con't OTC PRN meds and avoid food triggers

## 2019-01-14 NOTE — Assessment & Plan Note (Signed)
Onset 5 years ago, much better over the years, pt even weaning off meds w/o SE Decrease lexapro dose to 5 mg daily for 1-2 months and then wean slowly. F/up with any worsening mood or any SE of med changes

## 2019-01-14 NOTE — Assessment & Plan Note (Signed)
Compliant with meds Repeat FLP and CMP today ASCVD score with past labs is 13.2%, will recalculate with labs from today Advised avoiding transfat and saturated fat in diet, work on exercising for good heart health May want to transition her to a high intensity statin

## 2019-01-14 NOTE — Progress Notes (Signed)
Patient ID: Sonya Wade, female    DOB: December 22, 1945, 73 y.o.   MRN: [id number]  PCP: Arnetha Courser, MD  Chief Complaint  Patient presents with  . Ear Fullness    headache left side, swimmers ear  . Hyperlipidemia    Subjective:   Sonya Wade is a 73 y.o. female, presents to clinic with CC of the following:  Acute left ear discomfort x 1-2 weeks, feels full/blocked and when laying down at night seems like there is fluid in her ear.  She denies pain, swelling, redness, drainage.  She has hx of allergic rhinitis with associated intermittent ear sx, and is on flonase and zyrtec (for years).  She denies dizziness, tinnitis.  She does have nasal congestion right now.  It seems to be fairly consistent year round and not as much seasonal.  Denies HA, sinus pain/pressure, sore throat, fever, chills, sweats.  She also is a pt of Dr. Sanda Klein and needs to establish care with new PCP and f/up on her chronic conditions and needs some med refills.  We discussed the following Dx on her problem list and she is due for repeat labs and is fasting.  HLD: Patient has been on Lipitor 20 mg without any side effects.  She denies myalgias or fatigue.  She states that Dr. Sanda Klein did increase it a few months ago from 10 to 20 mg.  She had no problem with the dose increase.  She is not working on any specific diet, not avoiding trans-fat or saturated fat.  She is also on baby aspirin has no bleeding or bruising concerns.  She is fasting today. Lab Results  Component Value Date   CHOL 172 06/18/2018   HDL 65 06/18/2018   LDLCALC 84 06/18/2018   TRIG 125 06/18/2018   CHOLHDL 2.6 06/18/2018  The 10-year ASCVD risk score Mikey Bussing DC Jr., et al., 2013) is: 13.2%   Values used to calculate the score:     Age: 70 years     Sex: Female     Is Non-Hispanic African American: Yes     Diabetic: No     Tobacco smoker: No     Systolic Blood Pressure: [id number] mmHg     Is BP treated: No     HDL  Cholesterol: 65 mg/dL     Total Cholesterol: 172 mg/dL  Prediabetes: Lab Results  Component Value Date   HGBA1C 6.0 (H) 06/18/2018  A1C worsening over the past couple years 5.7 to recently 6.0.  Pt not working on diet or exercise, will repeat today.  She denies polyuria, weight loss, excessive hunger or thirst.  Weight has increased by about 8 lbs over the past year, BMI now in obese category. Wt Readings from Last 5 Encounters:  01/14/19 177 lb 11.2 oz (80.6 kg)  10/27/18 175 lb (79.4 kg)  04/06/18 169 lb 12.8 oz (77 kg)  03/27/18 167 lb (75.8 kg)  10/24/17 168 lb (76.2 kg)   BMI Readings from Last 5 Encounters:  01/14/19 30.50 kg/m  10/27/18 30.04 kg/m  04/06/18 29.15 kg/m  03/27/18 28.67 kg/m  10/24/17 28.84 kg/m   GERD: Pt is not taking omeprazole any more.  She will occasionally get a little bit of indigestion and acid reflux with certain foods like tomato sauce and she will take Tums or Pepcid and that will manage her symptoms.  She is not having any daily symptoms, denies abdominal pain, bloating, indigestion, melena, hematochezia, weight loss, nausea  or vomiting  Anxiety/depression: Pt has this diagnosis on her chart with lexapro 10 mg.  She says sx started 4-5 years ago, she was depressed, was at that time struggling to find a job.  She was managed by Dr. Sanda Klein on ativan and lexapro 10 mg.  She stopped taking ativan and currently takes lexapro 10 mg every other day, and misses doses sometimes more than that (taking almost PRN).  Her mood is good, she denies anxiety attacks, excessive worry, irritability, SI, HI, AVH.  She denies any known SE currently or in the past from lexapro or when she started skipping doses. Depression screen [facility] 2/9 01/14/2019 04/06/2018 03/27/2018  Decreased Interest 0 0 0  Down, Depressed, Hopeless 0 0 0  PHQ - 2 Score 0 0 0  Altered sleeping 0 0 0  Tired, decreased energy 0 0 0  Change in appetite 0 0 0  Feeling bad or failure about yourself  0  0 0  Trouble concentrating 0 0 0  Moving slowly or fidgety/restless 0 0 0  Suicidal thoughts 0 0 0  PHQ-9 Score 0 0 0  Difficult doing work/chores Not difficult at all Not difficult at all Not difficult at all   Lower extremity edema: Patient reports that over the past 1 to 2 years she has had worsening swelling in her legs a little worse on her left than her right she has asked about it before and she was told it was just some swelling and fluid collection.  She is not on a low-salt diet, she is sitting around a lot not walking or staying active like she used to.  She has not tried any compression stockings and the swelling does seem to improve when she elevates her legs or overnight while sleeping they are better in the morning.  She has had no pitting edema, rashes, wounds sores, tingling numbness.  She denies orthopnea, PND, palpitations, exertional shortness of breath   Patient Active Problem List   Diagnosis Date Noted  . Medication monitoring encounter 04/15/2018  . Pre-diabetes 06/03/2016  . Diffuse cystic mastopathy   . Family history of malignant neoplasm of breast   . Hyperlipidemia 11/09/2014  . Anxiety and depression 09/19/2014  . Arthritis of knee, degenerative 09/19/2014  . Esophageal reflux 09/19/2014  . Arthritis, degenerative 11/16/2013  . Fibrocystic breast disease 07/07/2013  . Bloodgood disease 07/07/2013  . Allergic rhinitis 09/06/2009      Current Outpatient Medications:  .  aspirin 81 MG tablet, Take 81 mg by mouth daily., Disp: , Rfl:  .  atorvastatin (LIPITOR) 20 MG tablet, TAKE 1 TABLET BY MOUTH EVERYDAY AT BEDTIME, Disp: 90 tablet, Rfl: 0 .  Cholecalciferol 1000 UNITS tablet, Take 1,000 Units by mouth daily. , Disp: , Rfl:  .  escitalopram (LEXAPRO) 10 MG tablet, Take 0.5 tablets (5 mg total) by mouth daily., Disp: 15 tablet, Rfl: 2 .  fluticasone (FLONASE) 50 MCG/ACT nasal spray, Place 2 sprays into both nostrils daily. (Patient taking differently: Place  2 sprays into both nostrils daily. ), Disp: 16 g, Rfl: 6 .  folic acid (FOLVITE) A999333 MCG tablet, Take 400 mcg daily by mouth., Disp: , Rfl:  .  Multiple Vitamin (MULTIVITAMIN) tablet, Take 1 tablet by mouth daily., Disp: , Rfl:  .  Omega-3 Fatty Acids (FISH OIL PO), Take 1,400 mg by mouth 2 (two) times daily. , Disp: , Rfl:  .  omeprazole (PRILOSEC) 20 MG capsule, TAKE 1 CAPSULE BY MOUTH EVERY DAY, Disp: 90  capsule, Rfl: 1   No Known Allergies   Family History  Problem Relation Age of Onset  . Breast cancer Mother 74  . Diabetes Father   . Hypertension Sister   . Colon cancer Neg Hx   . Ovarian cancer Neg Hx   . Cervical cancer Neg Hx      Social History   Socioeconomic History  . Marital status: Married    Spouse name: Winferd Humphrey  . Number of children: 1  . Years of education: 50  . Highest education level: High school graduate  Occupational History  . Not on file  Social Needs  . Financial resource strain: Not hard at all  . Food insecurity    Worry: Never true    Inability: Never true  . Transportation needs    Medical: No    Non-medical: No  Tobacco Use  . Smoking status: Never Smoker  . Smokeless tobacco: Never Used  Substance and Sexual Activity  . Alcohol use: No  . Drug use: No  . Sexual activity: Yes  Lifestyle  . Physical activity    Days per week: 6 days    Minutes per session: 30 min  . Stress: Not at all  Relationships  . Social connections    Talks on phone: More than three times a week    Gets together: Twice a week    Attends religious service: More than 4 times per year    Active member of club or organization: Yes    Attends meetings of clubs or organizations: Never    Relationship status: Married  . Intimate partner violence    Fear of current or ex partner: No    Emotionally abused: No    Physically abused: No    Forced sexual activity: No  Other Topics Concern  . Not on file  Social History Narrative  . Not on file    I  personally reviewed active problem list, medication list, allergies, family history, social history, health maintenance, notes from last encounter, lab results, other dexa, Mammogram, last cologuard with the patient/caregiver today.  Review of Systems  Constitutional: Negative.  Negative for activity change, appetite change, fatigue and unexpected weight change.  HENT: Negative.  Negative for ear discharge, ear pain and tinnitus.   Eyes: Negative.   Respiratory: Negative.  Negative for cough, shortness of breath and wheezing.   Cardiovascular: Positive for leg swelling. Negative for chest pain and palpitations.  Gastrointestinal: Negative.  Negative for abdominal pain, blood in stool, constipation, diarrhea, nausea and vomiting.  Endocrine: Negative.   Genitourinary: Negative.   Musculoskeletal: Negative.  Negative for arthralgias, gait problem, joint swelling and myalgias.  Skin: Negative.  Negative for color change, pallor and rash.  Allergic/Immunologic: Positive for environmental allergies. Negative for immunocompromised state.  Neurological: Negative.  Negative for dizziness, syncope, facial asymmetry, weakness, light-headedness and headaches.  Hematological: Negative.   Psychiatric/Behavioral: Negative.  Negative for confusion, dysphoric mood, self-injury, sleep disturbance and suicidal ideas. The patient is not nervous/anxious.        Objective:   Vitals:   01/14/19 0951  BP: 134/86  Pulse: 62  Resp: 16  Temp: (!) 97.2 F (36.2 C)  TempSrc: Oral  SpO2: 99%  Weight: 177 lb 11.2 oz (80.6 kg)  Height: 5\' 4"  (1.626 m)    Body mass index is 30.5 kg/m.  Physical Exam Vitals signs and nursing note reviewed.  Constitutional:      General: She is not in acute distress.  Appearance: Normal appearance. She is well-developed. She is obese. She is not ill-appearing, toxic-appearing or diaphoretic.  HENT:     Head: Normocephalic and atraumatic.     Right Ear: Tympanic membrane,  ear canal and external ear normal. There is no impacted cerumen.     Left Ear: Tympanic membrane, ear canal and external ear normal. There is no impacted cerumen.     Nose: Congestion present. No rhinorrhea.     Mouth/Throat:     Mouth: Mucous membranes are moist.     Pharynx: Oropharynx is clear. Uvula midline. No oropharyngeal exudate or posterior oropharyngeal erythema.  Eyes:     General: Lids are normal. No scleral icterus.    Conjunctiva/sclera: Conjunctivae normal.     Pupils: Pupils are equal, round, and reactive to light.  Neck:     Musculoskeletal: Normal range of motion and neck supple.     Trachea: Phonation normal. No tracheal deviation.  Cardiovascular:     Rate and Rhythm: Normal rate and regular rhythm.     Pulses: Normal pulses.          Radial pulses are 2+ on the right side and 2+ on the left side.       Posterior tibial pulses are 2+ on the right side and 2+ on the left side.     Heart sounds: Normal heart sounds. No murmur. No friction rub. No gallop.   Pulmonary:     Effort: Pulmonary effort is normal. No respiratory distress.     Breath sounds: Normal breath sounds. No stridor. No wheezing, rhonchi or rales.  Chest:     Chest wall: No tenderness.  Abdominal:     General: Bowel sounds are normal. There is no distension.     Palpations: Abdomen is soft.     Tenderness: There is no abdominal tenderness. There is no guarding or rebound.  Musculoskeletal: Normal range of motion.        General: No deformity.     Right lower leg: No edema.     Left lower leg: No edema.  Lymphadenopathy:     Cervical: No cervical adenopathy.  Skin:    General: Skin is warm and dry.     Capillary Refill: Capillary refill takes less than 2 seconds.     Coloration: Skin is not jaundiced or pale.     Findings: No bruising, erythema or rash.  Neurological:     Mental Status: She is alert.     Motor: No weakness or abnormal muscle tone.     Coordination: Coordination normal.      Gait: Gait normal.  Psychiatric:        Mood and Affect: Mood normal.        Speech: Speech normal.        Behavior: Behavior normal.        Thought Content: Thought content normal.        Judgment: Judgment normal.      Results for orders placed or performed in visit on 06/18/18  Hemoglobin A1c  Result Value Ref Range   Hgb A1c MFr Bld 6.0 (H) <5.7 % of total Hgb   Mean Plasma Glucose 126 (calc)   eAG (mmol/L) 7.0 (calc)  Lipid panel  Result Value Ref Range   Cholesterol 172 <200 mg/dL   HDL 65 >50 mg/dL   Triglycerides 125 <150 mg/dL   LDL Cholesterol (Calc) 84 mg/dL (calc)   Total CHOL/HDL Ratio 2.6 <5.0 (calc)   Non-HDL Cholesterol (Calc)  107 <130 mg/dL (calc)  COMPLETE METABOLIC PANEL WITH GFR  Result Value Ref Range   Glucose, Bld 88 65 - 99 mg/dL   BUN 20 7 - 25 mg/dL   Creat 0.90 0.60 - 0.93 mg/dL   GFR, Est Non African American 64 > OR = 60 mL/min/1.10m2   GFR, Est African American 75 > OR = 60 mL/min/1.31m2   BUN/Creatinine Ratio NOT APPLICABLE 6 - 22 (calc)   Sodium 143 135 - 146 mmol/L   Potassium 4.6 3.5 - 5.3 mmol/L   Chloride 105 98 - 110 mmol/L   CO2 29 20 - 32 mmol/L   Calcium 9.8 8.6 - 10.4 mg/dL   Total Protein 7.3 6.1 - 8.1 g/dL   Albumin 4.3 3.6 - 5.1 g/dL   Globulin 3.0 1.9 - 3.7 g/dL (calc)   AG Ratio 1.4 1.0 - 2.5 (calc)   Total Bilirubin 0.4 0.2 - 1.2 mg/dL   Alkaline phosphatase (APISO) 72 33 - 130 U/L   AST 16 10 - 35 U/L   ALT 11 6 - 29 U/L        Assessment & Plan:   Pt walked in with acute complaints today, also wanted to be seen for chronic conditions, needed refills and labs done, fortunately was able to be worked into the schedule today, agreed to establish care with me as new PCP since Dr. Sanda Klein retired from clinic.    Addressed several acute complaints today and did routine f/up on all her chronic conditions as outlined below:   Problem List Items Addressed This Visit      Respiratory   Allergic rhinitis    Nasal edema and  congestion on exam Continue flonase, change 2nd gen antihistamine Expect gradual improvement over a few weeks      Relevant Medications   fluticasone (FLONASE) 50 MCG/ACT nasal spray   loratadine (CLARITIN) 10 MG tablet     Digestive   Esophageal reflux    Improving, not taking PPI, only using prn tums and pepcid con't OTC PRN meds and avoid food triggers        Other   Hyperlipidemia - Primary (Chronic)    Compliant with meds Repeat FLP and CMP today ASCVD score with past labs is 13.2%, will recalculate with labs from today Advised avoiding transfat and saturated fat in diet, work on exercising for good heart health May want to transition her to a high intensity statin      Relevant Medications   atorvastatin (LIPITOR) 20 MG tablet   Other Relevant Orders   COMPLETE METABOLIC PANEL WITH GFR   Lipid panel   Pre-diabetes (Chronic)    Continue to work on healthy diet and exercise Repeat A1C today Goal for age < 7.0% No sx of hyperglycemia      Relevant Orders   COMPLETE METABOLIC PANEL WITH GFR   Hemoglobin A1c   Anxiety and depression    Onset 5 years ago, much better over the years, pt even weaning off meds w/o SE Decrease lexapro dose to 5 mg daily for 1-2 months and then wean slowly. F/up with any worsening mood or any SE of med changes      Relevant Medications   escitalopram (LEXAPRO) 5 MG tablet    Other Visit Diagnoses    Medication monitoring encounter       Relevant Orders   COMPLETE METABOLIC PANEL WITH GFR   Lipid panel   Hemoglobin A1c   CBC with Differential/Platelet   Acute dysfunction  of left eustachian tube       secondary to rhinosinusitis, continue flonase and switch 2nd gen antihistamine   Relevant Medications   fluticasone (FLONASE) 50 MCG/ACT nasal spray   loratadine (CLARITIN) 10 MG tablet   Bilateral lower extremity edema       low salt diet, compression stocking, elevate legs, avoid immobility, walking, and f/up if not improving,  consider low dose HCTZ next if not improving? No concern for CHF today, pt appears euvolemic, sx mild and likely due to dependent edema    Class 1 obesity with serious comorbidity and body mass index (BMI) of 30.0 to 30.9 in adult, unspecified obesity type       diet and exercise - with associated HLD and prediabetes    Establishing care with new doctor, encounter for       Reviewed hx, meds, labs, extensive chart review and extra time spent with pt in exam room to clarify and update problem and med lists     Advised to return in October for flu clinic.   Return in about 6 months (around 07/17/2019) for f/up for chronic conditions and MWV when due.   Delsa Grana, PA-C 01/14/19 10:12 AM

## 2019-01-14 NOTE — Assessment & Plan Note (Signed)
Nasal edema and congestion on exam Continue flonase, change 2nd gen antihistamine Expect gradual improvement over a few weeks

## 2019-01-14 NOTE — Assessment & Plan Note (Signed)
Continue to work on healthy diet and exercise Repeat A1C today Goal for age < 7.0% No sx of hyperglycemia

## 2019-01-15 ENCOUNTER — Other Ambulatory Visit: Payer: Self-pay | Admitting: Family Medicine

## 2019-01-15 DIAGNOSIS — E782 Mixed hyperlipidemia: Secondary | ICD-10-CM

## 2019-01-15 LAB — CBC WITH DIFFERENTIAL/PLATELET
Absolute Monocytes: 448 cells/uL (ref 200–950)
Basophils Absolute: 28 cells/uL (ref 0–200)
Basophils Relative: 0.7 %
Eosinophils Absolute: 48 cells/uL (ref 15–500)
Eosinophils Relative: 1.2 %
HCT: 41.2 % (ref 35.0–45.0)
Hemoglobin: 13.1 g/dL (ref 11.7–15.5)
Lymphs Abs: 1408 cells/uL (ref 850–3900)
MCH: 26.9 pg — ABNORMAL LOW (ref 27.0–33.0)
MCHC: 31.8 g/dL — ABNORMAL LOW (ref 32.0–36.0)
MCV: 84.6 fL (ref 80.0–100.0)
MPV: 11.1 fL (ref 7.5–12.5)
Monocytes Relative: 11.2 %
Neutro Abs: 2068 cells/uL (ref 1500–7800)
Neutrophils Relative %: 51.7 %
Platelets: 204 10*3/uL (ref 140–400)
RBC: 4.87 10*6/uL (ref 3.80–5.10)
RDW: 13.2 % (ref 11.0–15.0)
Total Lymphocyte: 35.2 %
WBC: 4 10*3/uL (ref 3.8–10.8)

## 2019-01-15 LAB — HEMOGLOBIN A1C
Hgb A1c MFr Bld: 5.9 % of total Hgb — ABNORMAL HIGH (ref <5.7)
Mean Plasma Glucose: 123 (calc)
eAG (mmol/L): 6.8 (calc)

## 2019-01-15 LAB — LIPID PANEL
Cholesterol: 242 mg/dL — ABNORMAL HIGH (ref <200)
HDL: 59 mg/dL (ref >=50)
LDL Cholesterol (Calc): 150 mg/dL (calc) — ABNORMAL HIGH
Non-HDL Cholesterol (Calc): 183 mg/dL (calc) — ABNORMAL HIGH (ref <130)
Total CHOL/HDL Ratio: 4.1 (calc) (ref <5.0)
Triglycerides: 194 mg/dL — ABNORMAL HIGH (ref <150)

## 2019-01-15 LAB — COMPLETE METABOLIC PANEL WITH GFR
AG Ratio: 1.5 (calc) (ref 1.0–2.5)
ALT: 21 U/L (ref 6–29)
AST: 21 U/L (ref 10–35)
Albumin: 4 g/dL (ref 3.6–5.1)
Alkaline phosphatase (APISO): 71 U/L (ref 37–153)
BUN/Creatinine Ratio: 21 (calc) (ref 6–22)
BUN: 20 mg/dL (ref 7–25)
CO2: 28 mmol/L (ref 20–32)
Calcium: 9.2 mg/dL (ref 8.6–10.4)
Chloride: 106 mmol/L (ref 98–110)
Creat: 0.96 mg/dL — ABNORMAL HIGH (ref 0.60–0.93)
GFR, Est African American: 68 mL/min/{1.73_m2} (ref >=60)
GFR, Est Non African American: 59 mL/min/{1.73_m2} — ABNORMAL LOW (ref >=60)
Globulin: 2.7 g/dL (calc) (ref 1.9–3.7)
Glucose, Bld: 98 mg/dL (ref 65–99)
Potassium: 4 mmol/L (ref 3.5–5.3)
Sodium: 141 mmol/L (ref 135–146)
Total Bilirubin: 0.4 mg/dL (ref 0.2–1.2)
Total Protein: 6.7 g/dL (ref 6.1–8.1)

## 2019-01-15 NOTE — Progress Notes (Signed)
Okay thanks, we'll leave it alone and recheck it again next time.  Thank you

## 2019-01-26 ENCOUNTER — Other Ambulatory Visit: Payer: Self-pay | Admitting: Family Medicine

## 2019-01-26 DIAGNOSIS — Z23 Encounter for immunization: Secondary | ICD-10-CM | POA: Diagnosis not present

## 2019-02-18 ENCOUNTER — Ambulatory Visit (INDEPENDENT_AMBULATORY_CARE_PROVIDER_SITE_OTHER): Payer: Medicare Other

## 2019-02-18 VITALS — Ht 64.0 in | Wt 167.0 lb

## 2019-02-18 DIAGNOSIS — Z780 Asymptomatic menopausal state: Secondary | ICD-10-CM

## 2019-02-18 DIAGNOSIS — M8580 Other specified disorders of bone density and structure, unspecified site: Secondary | ICD-10-CM | POA: Diagnosis not present

## 2019-02-18 DIAGNOSIS — Z0000 Encounter for general adult medical examination without abnormal findings: Secondary | ICD-10-CM

## 2019-02-18 NOTE — Patient Instructions (Signed)
Sonya Wade , Thank you for taking time to come for your Medicare Wellness Visit. I appreciate your ongoing commitment to your health goals. Please review the following plan we discussed and let me know if I can assist you in the future.   Screening recommendations/referrals: Colonoscopy: Cologuard completed 09/23/17. Repeat in 2022 Mammogram: done 10/20/18 Bone Density: done 07/03/15. Please call [phone] to schedule your bone density screening.  Recommended yearly ophthalmology/optometry visit for glaucoma screening and checkup Recommended yearly dental visit for hygiene and checkup  Vaccinations: Influenza vaccine: done 01/26/19 Pneumococcal vaccine: done 04/22/14 Tdap vaccine: done 10/31/10 Shingles vaccine: requesting date from [place] directives: Advance directive discussed with you today. I have provided a copy for you to complete at home and have notarized. Once this is complete please bring a copy in to our office so we can scan it into your chart.  Conditions/risks identified: Keep up the great work!  Next appointment: Please follow up in one year for your Medicare Annual Wellness visit.     Preventive Care 35 Years and Older, Female Preventive care refers to lifestyle choices and visits with your health care provider that can promote health and wellness. What does preventive care include?  A yearly physical exam. This is also called an annual well check.  Dental exams once or twice a year.  Routine eye exams. Ask your health care provider how often you should have your eyes checked.  Personal lifestyle choices, including:  Daily care of your teeth and gums.  Regular physical activity.  Eating a healthy diet.  Avoiding tobacco and drug use.  Limiting alcohol use.  Practicing safe sex.  Taking low-dose aspirin every day.  Taking vitamin and mineral supplements as recommended by your health care provider. What happens during an annual well  check? The services and screenings done by your health care provider during your annual well check will depend on your age, overall health, lifestyle risk factors, and family history of disease. Counseling  Your health care provider may ask you questions about your:  Alcohol use.  Tobacco use.  Drug use.  Emotional well-being.  Home and relationship well-being.  Sexual activity.  Eating habits.  History of falls.  Memory and ability to understand (cognition).  Work and work Statistician.  Reproductive health. Screening  You may have the following tests or measurements:  Height, weight, and BMI.  Blood pressure.  Lipid and cholesterol levels. These may be checked every 5 years, or more frequently if you are over 17 years old.  Skin check.  Lung cancer screening. You may have this screening every year starting at age 48 if you have a 30-pack-year history of smoking and currently smoke or have quit within the past 15 years.  Fecal occult blood test (FOBT) of the stool. You may have this test every year starting at age 14.  Flexible sigmoidoscopy or colonoscopy. You may have a sigmoidoscopy every 5 years or a colonoscopy every 10 years starting at age 5.  Hepatitis C blood test.  Hepatitis B blood test.  Sexually transmitted disease (STD) testing.  Diabetes screening. This is done by checking your blood sugar (glucose) after you have not eaten for a while (fasting). You may have this done every 1-3 years.  Bone density scan. This is done to screen for osteoporosis. You may have this done starting at age 33.  Mammogram. This may be done every 1-2 years. Talk to your health care provider about how often you  should have regular mammograms. Talk with your health care provider about your test results, treatment options, and if necessary, the need for more tests. Vaccines  Your health care provider may recommend certain vaccines, such as:  Influenza vaccine. This is  recommended every year.  Tetanus, diphtheria, and acellular pertussis (Tdap, Td) vaccine. You may need a Td booster every 10 years.  Zoster vaccine. You may need this after age 28.  Pneumococcal 13-valent conjugate (PCV13) vaccine. One dose is recommended after age 76.  Pneumococcal polysaccharide (PPSV23) vaccine. One dose is recommended after age 24. Talk to your health care provider about which screenings and vaccines you need and how often you need them. This information is not intended to replace advice given to you by your health care provider. Make sure you discuss any questions you have with your health care provider. Document Released: 06/02/2015 Document Revised: 01/24/2016 Document Reviewed: 03/07/2015 Elsevier Interactive Patient Education  2017 [place] Prevention in the Home Falls can cause injuries. They can happen to people of all ages. There are many things you can do to make your home safe and to help prevent falls. What can I do on the outside of my home?  Regularly fix the edges of walkways and driveways and fix any cracks.  Remove anything that might make you trip as you walk through a door, such as a raised step or threshold.  Trim any bushes or trees on the path to your home.  Use bright outdoor lighting.  Clear any walking paths of anything that might make someone trip, such as rocks or tools.  Regularly check to see if handrails are loose or broken. Make sure that both sides of any steps have handrails.  Any raised decks and porches should have guardrails on the edges.  Have any leaves, snow, or ice cleared regularly.  Use sand or salt on walking paths during winter.  Clean up any spills in your garage right away. This includes oil or grease spills. What can I do in the bathroom?  Use night lights.  Install grab bars by the toilet and in the tub and shower. Do not use towel bars as grab bars.  Use non-skid mats or decals in the tub or  shower.  If you need to sit down in the shower, use a plastic, non-slip stool.  Keep the floor dry. Clean up any water that spills on the floor as soon as it happens.  Remove soap buildup in the tub or shower regularly.  Attach bath mats securely with double-sided non-slip rug tape.  Do not have throw rugs and other things on the floor that can make you trip. What can I do in the bedroom?  Use night lights.  Make sure that you have a light by your bed that is easy to reach.  Do not use any sheets or blankets that are too big for your bed. They should not hang down onto the floor.  Have a firm chair that has side arms. You can use this for support while you get dressed.  Do not have throw rugs and other things on the floor that can make you trip. What can I do in the kitchen?  Clean up any spills right away.  Avoid walking on wet floors.  Keep items that you use a lot in easy-to-reach places.  If you need to reach something above you, use a strong step stool that has a grab bar.  Keep electrical cords out  of the way.  Do not use floor polish or wax that makes floors slippery. If you must use wax, use non-skid floor wax.  Do not have throw rugs and other things on the floor that can make you trip. What can I do with my stairs?  Do not leave any items on the stairs.  Make sure that there are handrails on both sides of the stairs and use them. Fix handrails that are broken or loose. Make sure that handrails are as long as the stairways.  Check any carpeting to make sure that it is firmly attached to the stairs. Fix any carpet that is loose or worn.  Avoid having throw rugs at the top or bottom of the stairs. If you do have throw rugs, attach them to the floor with carpet tape.  Make sure that you have a light switch at the top of the stairs and the bottom of the stairs. If you do not have them, ask someone to add them for you. What else can I do to help prevent falls?   Wear shoes that:  Do not have high heels.  Have rubber bottoms.  Are comfortable and fit you well.  Are closed at the toe. Do not wear sandals.  If you use a stepladder:  Make sure that it is fully opened. Do not climb a closed stepladder.  Make sure that both sides of the stepladder are locked into place.  Ask someone to hold it for you, if possible.  Clearly mark and make sure that you can see:  Any grab bars or handrails.  First and last steps.  Where the edge of each step is.  Use tools that help you move around (mobility aids) if they are needed. These include:  Canes.  Walkers.  Scooters.  Crutches.  Turn on the lights when you go into a dark area. Replace any light bulbs as soon as they burn out.  Set up your furniture so you have a clear path. Avoid moving your furniture around.  If any of your floors are uneven, fix them.  If there are any pets around you, be aware of where they are.  Review your medicines with your doctor. Some medicines can make you feel dizzy. This can increase your chance of falling. Ask your doctor what other things that you can do to help prevent falls. This information is not intended to replace advice given to you by your health care provider. Make sure you discuss any questions you have with your health care provider. Document Released: 03/02/2009 Document Revised: 10/12/2015 Document Reviewed: 06/10/2014 Elsevier Interactive Patient Education  2017 [employer].

## 2019-02-18 NOTE — Progress Notes (Signed)
Subjective:   Sonya Wade is a 73 y.o. female who presents for Medicare Annual (Subsequent) preventive examination.  Virtual Visit via Telephone Note  I connected with Sonya Wade on 02/18/19 at 10:40 AM EDT by telephone and verified that I am speaking with the correct person using two identifiers.  Medicare Annual Wellness visit completed telephonically due to Covid-19 pandemic.   Location: Patient: home Provider: office   I discussed the limitations, risks, security and privacy concerns of performing an evaluation and management service by telephone and the availability of in person appointments. The patient expressed understanding and agreed to proceed.  Some vital signs may be absent or patient reported.   Clemetine Marker, LPN  Review of Systems:   Cardiac Risk Factors include: advanced age (>42men, >37 women);dyslipidemia     Objective:     Vitals: Ht 5\' 4"  (1.626 m)   Wt 167 lb (75.8 kg)   BMI 28.67 kg/m   Body mass index is 28.67 kg/m.  Advanced Directives 02/18/2019 09/24/2017 04/22/2017 04/02/2017 11/04/2014  Does Patient Have a Medical Advance Directive? No No No No No  Would patient like information on creating a medical advance directive? Yes (MAU/Ambulatory/Procedural Areas - Information given) No - Patient declined No - Patient declined No - Patient declined No - patient declined information    Tobacco Social History   Tobacco Use  Smoking Status Never Smoker  Smokeless Tobacco Never Used     Counseling given: Not Answered   Clinical Intake:  Pre-visit preparation completed: Yes  Pain : No/denies pain     BMI - recorded: 28.67 Nutritional Status: BMI 25 -29 Overweight Nutritional Risks: None Diabetes: No  How often do you need to have someone help you when you read instructions, pamphlets, or other written materials from your doctor or pharmacy?: 1 - Never  Interpreter Needed?: No  Information entered by :: Clemetine Marker  LPN  Past Medical History:  Diagnosis Date  . Allergy   . Arthritis   . Benign neoplasm of breast 2011  . Diffuse cystic mastopathy   . Family history of malignant neoplasm of breast   . Hyperlipidemia   . Lump or mass in breast    Past Surgical History:  Procedure Laterality Date  . BREAST BIOPSY Left 2013   core, apocine cyst. S marker  . BREAST BIOPSY Left 02/04/2017   Dr. Jamal Collin BX of 10:00 mass. R marker. INFLAMED BREAST PARENCHYMA WITH FIBROSIS  . BREAST CYST ASPIRATION Left 08/13/2016   FNA of 10:00 cyst done by DR. Sankar  . BREAST EXCISIONAL BIOPSY Left 2005   intraductal papilloma  . CATARACT EXTRACTION W/PHACO Right 04/02/2017   Procedure: CATARACT EXTRACTION PHACO AND INTRAOCULAR LENS PLACEMENT (IOC);  Surgeon: Birder Robson, MD;  Location: [facility] ORS;  Service: Ophthalmology;  Laterality: Right;  [place] 00:32 AP% 17.2 CDE 5.57 Fluid pack lot # [id number]  . CATARACT EXTRACTION W/PHACO Left 04/22/2017   Procedure: CATARACT EXTRACTION PHACO AND INTRAOCULAR LENS PLACEMENT ([place]);  Surgeon: Birder Robson, MD;  Location: [facility] ORS;  Service: Ophthalmology;  Laterality: Left;  [place]  00:36 AP% 13.6 CDE 4.95 Fluid pack lot # [id number]  . COLONOSCOPY  May 2008   Dr. Jamal Collin  . TUBAL LIGATION  1980   Family History  Problem Relation Age of Onset  . Breast cancer Mother 32  . Diabetes Father   . Hypertension Sister   . Colon cancer Neg Hx   . Ovarian cancer Neg Hx   .  Cervical cancer Neg Hx    Social History   Socioeconomic History  . Marital status: Married    Spouse name: Winferd Humphrey  . Number of children: 1  . Years of education: 57  . Highest education level: High school graduate  Occupational History  . Not on file  Social Needs  . Financial resource strain: Not hard at all  . Food insecurity    Worry: Never true    Inability: Never true  . Transportation needs    Medical: No    Non-medical: No  Tobacco Use  . Smoking status: Never Smoker  . Smokeless  tobacco: Never Used  Substance and Sexual Activity  . Alcohol use: No  . Drug use: No  . Sexual activity: Yes  Lifestyle  . Physical activity    Days per week: 6 days    Minutes per session: 30 min  . Stress: Not at all  Relationships  . Social connections    Talks on phone: More than three times a week    Gets together: Twice a week    Attends religious service: More than 4 times per year    Active member of club or organization: Yes    Attends meetings of clubs or organizations: Never    Relationship status: Married  Other Topics Concern  . Not on file  Social History Narrative  . Not on file    Outpatient Encounter Medications as of 02/18/2019  Medication Sig  . aspirin 81 MG tablet Take 81 mg by mouth daily.  Marland Kitchen atorvastatin (LIPITOR) 20 MG tablet Take 1 tablet (20 mg total) by mouth at bedtime.  . Cholecalciferol 1000 UNITS tablet Take 1,000 Units by mouth daily.   Marland Kitchen escitalopram (LEXAPRO) 5 MG tablet Take 1 tablet (5 mg total) by mouth daily.  . fluticasone (FLONASE) 50 MCG/ACT nasal spray Place 2 sprays into both nostrils daily.  . folic acid (FOLVITE) A999333 MCG tablet Take 400 mcg daily by mouth.  . loratadine (CLARITIN) 10 MG tablet Take 1 tablet (10 mg total) by mouth daily.  . Multiple Vitamin (MULTIVITAMIN) tablet Take 1 tablet by mouth daily.  . Omega-3 Fatty Acids (FISH OIL PO) Take 1,400 mg by mouth 2 (two) times daily.    No facility-administered encounter medications on file as of 02/18/2019.     Activities of Daily Living In your present state of health, do you have any difficulty performing the following activities: 02/18/2019 04/06/2018  Hearing? N N  Comment decline hearing aids -  Vision? N N  Difficulty concentrating or making decisions? N N  Walking or climbing stairs? N N  Dressing or bathing? N N  Doing errands, shopping? N N  Preparing Food and eating ? N -  Using the Toilet? N -  In the past six months, have you accidently leaked urine? N -  Do  you have problems with loss of bowel control? N -  Managing your Medications? N -  Managing your Finances? N -  Housekeeping or managing your Housekeeping? N -  Some recent data might be hidden    Patient Care Team: Delsa Grana, PA-C as PCP - General (Family Medicine) Bary Castilla Forest Gleason, MD as Consulting Physician (General Surgery)    Assessment:   This is a routine wellness examination for [place].  Exercise Activities and Dietary recommendations Current Exercise Habits: Home exercise routine, Type of exercise: walking, Time (Minutes): 30, Frequency (Times/Week): 6, Weekly Exercise (Minutes/Week): 180, Intensity: Moderate, Exercise limited by: None identified  Goals    . DIET - REDUCE FAT INTAKE     Recommend to cut out all fried foods in diet to help aid in weight loss.     . Exercise 150 minutes per week (moderate activity)       Fall Risk Fall Risk  02/18/2019 01/14/2019 10/27/2018 04/06/2018 03/27/2018  Falls in the past year? 0 0 0 0 0  Number falls in past yr: 0 0 0 - 0  Injury with Fall? 0 0 0 - 0  Follow up Falls prevention discussed Falls evaluation completed - - -   FALL RISK PREVENTION PERTAINING TO THE HOME:  Any stairs in or around the home? Yes  If so, do they handrails? Yes   Home free of loose throw rugs in walkways, pet beds, electrical cords, etc? Yes  Adequate lighting in your home to reduce risk of falls? Yes   ASSISTIVE DEVICES UTILIZED TO PREVENT FALLS:  Life alert? No  Use of a cane, walker or w/c? No  Grab bars in the bathroom? No  Shower chair or bench in shower? Yes Elevated toilet seat or a handicapped toilet? Yes   DME ORDERS:  DME order needed?  No   TIMED UP AND GO:  Was the test performed? No . Telephonic visit.   Education: Fall risk prevention has been discussed.  Intervention(s) required? No   Depression Screen PHQ 2/9 Scores 02/18/2019 01/14/2019 04/06/2018 03/27/2018  PHQ - 2 Score 0 0 0 0  PHQ- 9 Score - 0 0 0     Cognitive  Function     6CIT Screen 02/18/2019  What Year? 0 points  What month? 0 points  What time? 0 points  Count back from 20 0 points  Months in reverse 0 points  Repeat phrase 0 points  Total Score 0    Immunization History  Administered Date(s) Administered  . Influenza, High Dose Seasonal PF 02/18/2017, 02/06/2018, 01/26/2019  . Influenza-Unspecified 02/27/2015, 02/22/2016, 04/24/2017  . Pneumococcal Conjugate-13 04/22/2014  . Pneumococcal Polysaccharide-23 11/26/2011  . Tdap 10/31/2010  . Zoster 10/14/2014    Qualifies for Shingles Vaccine? Yes  Zostavax completed 2016. Due for Shingrix. Education has been provided regarding the importance of this vaccine. Pt has been advised to call insurance company to determine out of pocket expense. Advised may also receive vaccine at local pharmacy or Health Dept. Verbalized acceptance and understanding.  Tdap: Up to date  Flu Vaccine: Up to date  Pneumococcal Vaccine: Up to date    Screening Tests Health Maintenance  Topic Date Due  . INFLUENZA VACCINE  02/22/2019 (Originally 12/19/2018)  . Fecal DNA (Cologuard)  09/23/2020  . MAMMOGRAM  10/19/2020  . TETANUS/TDAP  10/30/2020  . DEXA SCAN  Completed  . Hepatitis C Screening  Completed  . PNA vac Low Risk Adult  Completed   Cancer Screenings:  Colorectal Screening: Cologuard completed 09/23/17. Repeat every 3 years;   Mammogram: Completed 10/20/18. Repeat every year;   Bone Density: Completed 07/03/15. Results reflect OSTEOPENIA. Repeat every 2 years. Ordered today. Pt provided with contact information and advised to call to schedule appt.   Lung Cancer Screening: (Low Dose CT Chest recommended if Age 32-80 years, 30 pack-year currently smoking OR have quit w/in 15years.) does not qualify.   Lung Cancer Screening Referral: An Epic message has been sent to Burgess Estelle, RN (Oncology Nurse Navigator) regarding the possible need for this exam. Raquel Sarna will review the patient's chart to  determine if the patient truly  qualifies for the exam. If the patient qualifies, Raquel Sarna will order the Low Dose CT of the chest to facilitate the scheduling of this exam.  Additional Screening:  Hepatitis C Screening: does qualify; Completed 11/27/12  Vision Screening: Recommended annual ophthalmology exams for early detection of glaucoma and other disorders of the eye. Is the patient up to date with their annual eye exam?  Yes  Who is the provider or what is the name of the office in which the pt attends annual eye exams? [place] Screening: Recommended annual dental exams for proper oral hygiene  Community Resource Referral:  CRR required this visit?  No      Plan:     I have personally reviewed and addressed the Medicare Annual Wellness questionnaire and have noted the following in the patient's chart:  A. Medical and social history B. Use of alcohol, tobacco or illicit drugs  C. Current medications and supplements D. Functional ability and status E.  Nutritional status F.  Physical activity G. Advance directives H. List of other physicians I.  Hospitalizations, surgeries, and ER visits in previous 12 months J.  [place] such as hearing and vision if needed, cognitive and depression L. Referrals and appointments   In addition, I have reviewed and discussed with patient certain preventive protocols, quality metrics, and best practice recommendations. A written personalized care plan for preventive services as well as general preventive health recommendations were provided to patient.   Signed,  Clemetine Marker, LPN Nurse Health Advisor   Nurse Notes: pt doing well and appreciative of visit today

## 2019-05-28 DIAGNOSIS — H43813 Vitreous degeneration, bilateral: Secondary | ICD-10-CM | POA: Diagnosis not present

## 2019-06-24 ENCOUNTER — Other Ambulatory Visit: Payer: Self-pay | Admitting: Family Medicine

## 2019-06-29 DIAGNOSIS — R69 Illness, unspecified: Secondary | ICD-10-CM | POA: Diagnosis not present

## 2019-06-29 DIAGNOSIS — E6609 Other obesity due to excess calories: Secondary | ICD-10-CM | POA: Diagnosis not present

## 2019-06-29 DIAGNOSIS — M1712 Unilateral primary osteoarthritis, left knee: Secondary | ICD-10-CM | POA: Diagnosis not present

## 2019-06-29 DIAGNOSIS — Z1159 Encounter for screening for other viral diseases: Secondary | ICD-10-CM | POA: Diagnosis not present

## 2019-06-29 DIAGNOSIS — R7303 Prediabetes: Secondary | ICD-10-CM | POA: Diagnosis not present

## 2019-06-29 DIAGNOSIS — Z6831 Body mass index (BMI) 31.0-31.9, adult: Secondary | ICD-10-CM | POA: Diagnosis not present

## 2019-06-29 DIAGNOSIS — E7800 Pure hypercholesterolemia, unspecified: Secondary | ICD-10-CM | POA: Diagnosis not present

## 2019-07-19 ENCOUNTER — Ambulatory Visit: Payer: Medicare Other | Admitting: Family Medicine

## 2019-08-05 ENCOUNTER — Other Ambulatory Visit: Payer: Self-pay | Admitting: Family Medicine

## 2019-08-05 NOTE — Telephone Encounter (Signed)
Requested medication (s) are due for refill today: no  Requested medication (s) are on the active medication list: no  Last refill: Medication not on current med list  Future visit scheduled: yes   Notes to clinic:  Medication not on current med list    Requested Prescriptions  Pending Prescriptions Disp Refills   omeprazole (PRILOSEC) 20 MG capsule [Pharmacy Med Name: OMEPRAZOLE DR 20 MG CAPSULE] 90 capsule     Sig: TAKE 1 [place]      Gastroenterology: Proton Pump Inhibitors Passed - 08/05/2019  4:45 PM      Passed - Valid encounter within last 12 months    Recent Outpatient Visits           6 months ago Mixed hyperlipidemia   [place], [place]   1 year ago Encounter for completion of form with patient   [facility] [place], Satira Anis, MD   1 year ago Mixed hyperlipidemia   [facility] [place], Satira Anis, MD   1 year ago Encounter for annual physical exam   [facility] [place], Dionne Bucy, MD   2 years ago Viral URI   [facility] [place], Dionne Bucy, MD       Future Appointments             In 6 months [facility], [facility]

## 2019-09-02 DIAGNOSIS — M19012 Primary osteoarthritis, left shoulder: Secondary | ICD-10-CM | POA: Diagnosis not present

## 2019-09-02 DIAGNOSIS — M25512 Pain in left shoulder: Secondary | ICD-10-CM | POA: Diagnosis not present

## 2019-09-17 ENCOUNTER — Other Ambulatory Visit: Payer: Self-pay | Admitting: Family Medicine

## 2019-10-21 DIAGNOSIS — Z1231 Encounter for screening mammogram for malignant neoplasm of breast: Secondary | ICD-10-CM | POA: Diagnosis not present

## 2019-10-27 DIAGNOSIS — M1712 Unilateral primary osteoarthritis, left knee: Secondary | ICD-10-CM | POA: Diagnosis not present

## 2019-10-27 DIAGNOSIS — M25562 Pain in left knee: Secondary | ICD-10-CM | POA: Diagnosis not present

## 2019-11-04 DIAGNOSIS — Z803 Family history of malignant neoplasm of breast: Secondary | ICD-10-CM | POA: Diagnosis not present

## 2019-11-11 ENCOUNTER — Encounter: Payer: Self-pay | Admitting: Family Medicine

## 2019-12-27 DIAGNOSIS — Z0000 Encounter for general adult medical examination without abnormal findings: Secondary | ICD-10-CM | POA: Diagnosis not present

## 2019-12-27 DIAGNOSIS — R7303 Prediabetes: Secondary | ICD-10-CM | POA: Diagnosis not present

## 2019-12-27 DIAGNOSIS — E7800 Pure hypercholesterolemia, unspecified: Secondary | ICD-10-CM | POA: Diagnosis not present

## 2020-01-31 DIAGNOSIS — R69 Illness, unspecified: Secondary | ICD-10-CM | POA: Diagnosis not present

## 2020-02-22 ENCOUNTER — Ambulatory Visit: Payer: Medicare Other

## 2020-02-26 ENCOUNTER — Other Ambulatory Visit: Payer: Self-pay | Admitting: Family Medicine

## 2020-02-26 DIAGNOSIS — E782 Mixed hyperlipidemia: Secondary | ICD-10-CM

## 2020-04-10 DIAGNOSIS — M79622 Pain in left upper arm: Secondary | ICD-10-CM | POA: Diagnosis not present

## 2020-04-28 DIAGNOSIS — M25512 Pain in left shoulder: Secondary | ICD-10-CM | POA: Diagnosis not present

## 2020-04-28 DIAGNOSIS — M25612 Stiffness of left shoulder, not elsewhere classified: Secondary | ICD-10-CM | POA: Diagnosis not present

## 2020-04-28 DIAGNOSIS — R29898 Other symptoms and signs involving the musculoskeletal system: Secondary | ICD-10-CM | POA: Diagnosis not present

## 2020-08-06 DIAGNOSIS — R030 Elevated blood-pressure reading, without diagnosis of hypertension: Secondary | ICD-10-CM | POA: Diagnosis not present

## 2020-08-06 DIAGNOSIS — Z6833 Body mass index (BMI) 33.0-33.9, adult: Secondary | ICD-10-CM | POA: Diagnosis not present

## 2020-08-06 DIAGNOSIS — E785 Hyperlipidemia, unspecified: Secondary | ICD-10-CM | POA: Diagnosis not present

## 2020-08-06 DIAGNOSIS — M1990 Unspecified osteoarthritis, unspecified site: Secondary | ICD-10-CM | POA: Diagnosis not present

## 2020-08-06 DIAGNOSIS — Z803 Family history of malignant neoplasm of breast: Secondary | ICD-10-CM | POA: Diagnosis not present

## 2020-08-06 DIAGNOSIS — E669 Obesity, unspecified: Secondary | ICD-10-CM | POA: Diagnosis not present

## 2020-08-11 DIAGNOSIS — E7800 Pure hypercholesterolemia, unspecified: Secondary | ICD-10-CM | POA: Diagnosis not present

## 2020-08-11 DIAGNOSIS — R7303 Prediabetes: Secondary | ICD-10-CM | POA: Diagnosis not present

## 2020-08-11 DIAGNOSIS — Z6831 Body mass index (BMI) 31.0-31.9, adult: Secondary | ICD-10-CM | POA: Diagnosis not present

## 2020-08-11 DIAGNOSIS — E6609 Other obesity due to excess calories: Secondary | ICD-10-CM | POA: Diagnosis not present

## 2020-08-17 DIAGNOSIS — M19041 Primary osteoarthritis, right hand: Secondary | ICD-10-CM | POA: Diagnosis not present

## 2020-08-17 DIAGNOSIS — E7800 Pure hypercholesterolemia, unspecified: Secondary | ICD-10-CM | POA: Diagnosis not present

## 2020-08-17 DIAGNOSIS — G8929 Other chronic pain: Secondary | ICD-10-CM | POA: Diagnosis not present

## 2020-08-17 DIAGNOSIS — Z6833 Body mass index (BMI) 33.0-33.9, adult: Secondary | ICD-10-CM | POA: Diagnosis not present

## 2020-08-17 DIAGNOSIS — R69 Illness, unspecified: Secondary | ICD-10-CM | POA: Diagnosis not present

## 2020-08-17 DIAGNOSIS — E6609 Other obesity due to excess calories: Secondary | ICD-10-CM | POA: Diagnosis not present

## 2020-08-17 DIAGNOSIS — M79644 Pain in right finger(s): Secondary | ICD-10-CM | POA: Diagnosis not present

## 2020-08-17 DIAGNOSIS — R7303 Prediabetes: Secondary | ICD-10-CM | POA: Diagnosis not present

## 2020-09-20 DIAGNOSIS — M25562 Pain in left knee: Secondary | ICD-10-CM | POA: Diagnosis not present

## 2020-09-20 DIAGNOSIS — M1712 Unilateral primary osteoarthritis, left knee: Secondary | ICD-10-CM | POA: Diagnosis not present

## 2020-09-20 DIAGNOSIS — M25462 Effusion, left knee: Secondary | ICD-10-CM | POA: Diagnosis not present

## 2020-10-26 DIAGNOSIS — Z1231 Encounter for screening mammogram for malignant neoplasm of breast: Secondary | ICD-10-CM | POA: Diagnosis not present

## 2020-11-02 DIAGNOSIS — N6011 Diffuse cystic mastopathy of right breast: Secondary | ICD-10-CM | POA: Diagnosis not present

## 2020-11-02 DIAGNOSIS — N6012 Diffuse cystic mastopathy of left breast: Secondary | ICD-10-CM | POA: Diagnosis not present

## 2020-12-15 ENCOUNTER — Other Ambulatory Visit: Payer: Self-pay | Admitting: General Surgery

## 2020-12-15 NOTE — Progress Notes (Signed)
Subjective:     Patient ID: Sonya Wade is a 75 y.o. female.   HPI   The following portions of the patient's history were reviewed and updated as appropriate.   This an established patient is here today for: office visit. The patient is here today to follow up from a recent mammogram. Patient reports she has no new breast concerns.  The patient has a family history of breast cancer in both a mother and sister.  Chronic transverse crease of the left nipple.   She has been encouraged by her PCP to consider colonoscopy.  Negative Cologuard in 2019.  Colonoscopy in 2009 was negative.       Chief Complaint  Patient presents with   Follow-up      Mammogram       BP (!) 140/72   Pulse 53   Temp 36.6 C (97.8 F)   Ht 160 cm ('5\' 3"'$ )   Wt 86.2 kg (190 lb)   SpO2 97%   BMI 33.66 kg/m        Past Medical History:  Diagnosis Date   Allergic rhinitis     Chickenpox     GERD (gastroesophageal reflux disease)     History of urinary incontinence     Hyperlipidemia     Osteoarthritis             Past Surgical History:  Procedure Laterality Date   Breast biopsy 2005 Left 2005   COLONOSCOPY   2009    Normal   PERCUTANEOUS BIOPSY BREAST VACUUM ASSISTED W/NEEDLE LOCALIZATION Left 02/04/2017    Inflamed breast parenchyma with fibrosis, no malignancy.( Dr. Jamal Collin) biopsy completed post aspiration with inflammatory changes and increase in size over time.   TUBAL LIGATION Bilateral                  OB History     Gravida  1   Para  1   Term      Preterm      AB      Living         SAB      IAB      Ectopic      Molar      Multiple      Live Births           Obstetric Comments  Age at first period 20 Age of first pregnancy 46 Menopause age 59              Social History           Socioeconomic History   Marital status: Married  Occupational History   Occupation: Retired   Occupation: Academic librarian  Tobacco Use    Smoking status: Never Smoker   Smokeless tobacco: Never Used  Scientific laboratory technician Use: Never used  Substance and Sexual Activity   Alcohol use: Not Currently      Alcohol/week: 0.0 standard drinks   Drug use: Never   Sexual activity: Yes      Partners: Male        No Known Allergies   Current Medications        Current Outpatient Medications  Medication Sig Dispense Refill   acetaminophen (TYLENOL) 500 MG tablet Take 2 tablets (1,000 mg total) by mouth every 8 (eight) hours 30 tablet 0   aspirin 81 MG EC tablet Take 81 mg by mouth once daily       atorvastatin (LIPITOR)  20 MG tablet TAKE 1 TABLET BY MOUTH EVERY DAY AT NIGHT 90 tablet 1   ELDERBERRY FRUIT ORAL Take 1 each by mouth once daily          escitalopram oxalate (LEXAPRO) 10 MG tablet Take 10 mg by mouth once daily.   3   fluticasone propionate (FLONASE) 50 mcg/actuation nasal spray SPRAY 2 SPRAYS INTO EACH NOSTRIL EVERY DAY 48 g 1   folic acid (FOLVITE) A999333 MCG tablet Take 400 mcg by mouth once daily          loratadine (CLARITIN) 10 mg capsule Take 10 mg by mouth once daily       multivitamin tablet Take 1 tablet by mouth once daily.       omega-3 fatty acids/fish oil 340-1,000 mg capsule Take 1 capsule by mouth once daily           No current facility-administered medications for this visit.             Family History  Problem Relation Age of Onset   Breast cancer Mother     Diabetes type II Father     High blood pressure (Hypertension) Sister     Colon cancer Sister              Review of Systems  Constitutional: Negative for chills and fever.  Respiratory: Negative for cough.          Objective:   Physical Exam Exam conducted with a chaperone present.  Constitutional:      Appearance: Normal appearance.  Cardiovascular:     Rate and Rhythm: Normal rate and regular rhythm.     Pulses: Normal pulses.     Heart sounds: Normal heart sounds.  Pulmonary:     Effort: Pulmonary effort is normal.      Breath sounds: Normal breath sounds.  Chest:  Breasts:     Right: Normal. No axillary adenopathy or supraclavicular adenopathy.     Left: Normal. No axillary adenopathy or supraclavicular adenopathy.      Musculoskeletal:       Arms:     Cervical back: Neck supple.  Lymphadenopathy:     Upper Body:     Right upper body: No supraclavicular or axillary adenopathy.     Left upper body: No supraclavicular or axillary adenopathy.  Skin:    General: Skin is warm and dry.  Neurological:     Mental Status: She is alert and oriented to person, place, and time.  Psychiatric:        Mood and Affect: Mood normal.        Behavior: Behavior normal.      Labs and Radiology:      Mammogram review:   October 26, 2020 [facility] mammograms independently reviewed.  No interval change.  1 year follow-up recommended.   Sep 17, 2017 Cologuard: Negative.   Colonoscopy: 2009, original data removed, 10-year follow-up recommended in database.   Hand x-ray of August 17, 2020:   X-ray of the hand reveals some bone development on the interphalangeal joint which could be consistent with an old fracture.  Unsure if this could be playing a role in pain but likely has some arthritis.      Assessment:      Stable breast exam.  Benign mammogram.   Candidate for colon cancer screening.   Right thumb pain at the MCP joint.    Plan:     The patient is a candidate for a repeat Cologuard  exam or formal colonoscopy.  Pros and cons of each reviewed.  Risks associated with colonoscopy discussed.   The patient will consider her options and notify the office of how she would like to proceed in this regard.   She was encouraged to touch base with her PCP regarding referral to orthopedics for her hand pain.  She may benefit from PT or injection.   Patient to return in 1 year with a bilateral screening mammogram.    This note is partially prepared by Ledell Noss, CMA acting as a scribe in the presence of  Dr. Hervey Ard, MD.    The documentation recorded by the scribe accurately reflects the service I personally performed and the decisions made by me.    Robert Bellow, MD FACS

## 2021-01-09 ENCOUNTER — Encounter: Payer: Self-pay | Admitting: General Surgery

## 2021-01-10 ENCOUNTER — Ambulatory Visit: Payer: Medicare HMO | Admitting: Registered Nurse

## 2021-01-10 ENCOUNTER — Ambulatory Visit
Admission: RE | Admit: 2021-01-10 | Discharge: 2021-01-10 | Disposition: A | Discharge status: Home / Self Care | Payer: Medicare HMO | Attending: General Surgery | Admitting: General Surgery

## 2021-01-10 ENCOUNTER — Encounter
Admission: RE | Disposition: A | Discharge status: Home / Self Care | Payer: Self-pay | Source: Home / Self Care | Attending: General Surgery

## 2021-01-10 ENCOUNTER — Encounter: Payer: Self-pay | Admitting: General Surgery

## 2021-01-10 DIAGNOSIS — D126 Benign neoplasm of colon, unspecified: Secondary | ICD-10-CM | POA: Diagnosis not present

## 2021-01-10 DIAGNOSIS — D122 Benign neoplasm of ascending colon: Secondary | ICD-10-CM | POA: Insufficient documentation

## 2021-01-10 DIAGNOSIS — D123 Benign neoplasm of transverse colon: Secondary | ICD-10-CM | POA: Insufficient documentation

## 2021-01-10 DIAGNOSIS — D124 Benign neoplasm of descending colon: Secondary | ICD-10-CM | POA: Diagnosis not present

## 2021-01-10 DIAGNOSIS — Z1211 Encounter for screening for malignant neoplasm of colon: Secondary | ICD-10-CM | POA: Diagnosis not present

## 2021-01-10 DIAGNOSIS — Z7982 Long term (current) use of aspirin: Secondary | ICD-10-CM | POA: Diagnosis not present

## 2021-01-10 DIAGNOSIS — Z79899 Other long term (current) drug therapy: Secondary | ICD-10-CM | POA: Insufficient documentation

## 2021-01-10 DIAGNOSIS — K635 Polyp of colon: Secondary | ICD-10-CM | POA: Diagnosis not present

## 2021-01-10 DIAGNOSIS — E785 Hyperlipidemia, unspecified: Secondary | ICD-10-CM | POA: Diagnosis not present

## 2021-01-10 HISTORY — PX: COLONOSCOPY WITH PROPOFOL: SHX5780

## 2021-01-10 SURGERY — COLONOSCOPY WITH PROPOFOL
Anesthesia: General

## 2021-01-10 MED ORDER — PROPOFOL 500 MG/50ML IV EMUL
INTRAVENOUS | Status: AC
  Filled 2021-01-10: qty 50

## 2021-01-10 MED ORDER — PROPOFOL 10 MG/ML IV BOLUS
INTRAVENOUS | Status: DC | PRN
  Administered 2021-01-10: 70 mg via INTRAVENOUS

## 2021-01-10 MED ORDER — PROPOFOL 500 MG/50ML IV EMUL
INTRAVENOUS | Status: DC | PRN
  Administered 2021-01-10: 150 ug/kg/min via INTRAVENOUS

## 2021-01-10 MED ORDER — SODIUM CHLORIDE 0.9 % IV SOLN
INTRAVENOUS | Status: DC
  Administered 2021-01-10: 1000 mL via INTRAVENOUS

## 2021-01-10 MED ORDER — LIDOCAINE HCL (CARDIAC) PF 100 MG/5ML IV SOSY
PREFILLED_SYRINGE | INTRAVENOUS | Status: DC | PRN
  Administered 2021-01-10: 40 mg via INTRAVENOUS

## 2021-01-10 NOTE — Anesthesia Preprocedure Evaluation (Addendum)
Anesthesia Evaluation   Patient awake    Reviewed: Allergy & Precautions, NPO status , Patient's Chart, lab work & pertinent test results  Airway Mallampati: III  TM Distance: >3 FB Neck ROM: full    Dental  (+) Edentulous Upper, Partial Lower,    Pulmonary neg pulmonary ROS,    Pulmonary exam normal        Cardiovascular Exercise Tolerance: Good negative cardio ROS Normal cardiovascular exam     Neuro/Psych PSYCHIATRIC DISORDERS Anxiety Depression negative neurological ROS     GI/Hepatic Neg liver ROS, GERD  ,  Endo/Other  negative endocrine ROS  Renal/GU negative Renal ROS  negative genitourinary   Musculoskeletal  (+) Arthritis , Osteoarthritis,    Abdominal (+) + obese,   Peds  Hematology negative hematology ROS (+)   Anesthesia Other Findings Past Medical History: No date: Allergy No date: Arthritis 2011: Benign neoplasm of breast No date: Diffuse cystic mastopathy No date: Family history of malignant neoplasm of breast No date: Hyperlipidemia No date: Lump or mass in breast  Past Surgical History: 2013: BREAST BIOPSY; Left     Comment:  core, apocine cyst. S marker 02/04/2017: BREAST BIOPSY; Left     Comment:  Dr. Jamal Collin BX of 10:00 mass. R marker. INFLAMED BREAST               PARENCHYMA WITH FIBROSIS 08/13/2016: BREAST CYST ASPIRATION; Left     Comment:  FNA of 10:00 cyst done by DR. Sankar 2005: BREAST EXCISIONAL BIOPSY; Left     Comment:  intraductal papilloma 04/02/2017: CATARACT EXTRACTION W/PHACO; Right     Comment:  Procedure: CATARACT EXTRACTION PHACO AND INTRAOCULAR               LENS PLACEMENT ([place]);  Surgeon: Birder Robson, MD;                Location: [facility] ORS;  Service: Ophthalmology;  Laterality:              Right;  [place] 00:32 AP% 17.2 CDE 5.57 Fluid pack lot #               [id number] 04/22/2017: CATARACT EXTRACTION W/PHACO; Left     Comment:  Procedure: CATARACT EXTRACTION  PHACO AND INTRAOCULAR               LENS PLACEMENT ([facility]);  Surgeon: Birder Robson, MD;                Location: [facility] ORS;  Service: Ophthalmology;  Laterality:              Left;  [place]  00:36 AP% 13.6 CDE 4.95 Fluid pack lot #               [id number] 09/2006: COLONOSCOPY     Comment:  Dr. Jamal Collin No date: EYE SURGERY 1980: TUBAL LIGATION  BMI    Body Mass Index: 32.68 kg/m      Reproductive/Obstetrics negative OB ROS                            Anesthesia Physical Anesthesia Plan  ASA: 2  Anesthesia Plan: General   Post-op Pain Management:    Induction:   PONV Risk Score and Plan: Propofol infusion, TIVA and Treatment may vary due to age or medical condition  Airway Management Planned: Nasal Cannula and Natural Airway  Additional Equipment:   Intra-op Plan:   Post-operative Plan:   Informed  Consent: I have reviewed the patients History and Physical, chart, labs and discussed the procedure including the risks, benefits and alternatives for the proposed anesthesia with the patient or authorized representative who has indicated his/her understanding and acceptance.     Dental Advisory Given  Plan Discussed with: Anesthesiologist, CRNA and Surgeon  Anesthesia Plan Comments:         Anesthesia Quick Evaluation

## 2021-01-10 NOTE — Op Note (Signed)
[facility] Gastroenterology Patient Name: Sonya Wade Procedure Date: 01/10/2021 10:19 AM MRN: [id number] Account #: [id number] Date of Birth: Dec 02, 1945 Admit Type: Outpatient Age: 75 Room: [facility] ENDO ROOM 1 Gender: Female Note Status: Finalized Procedure:             Colonoscopy Indications:           Screening for colorectal malignant neoplasm Providers:             Robert Bellow, MD Referring MD:          Dion Body (Referring MD) Medicines:             Propofol per Anesthesia Complications:         No immediate complications. Procedure:             Pre-Anesthesia Assessment:                        - Prior to the procedure, a History and Physical was                         performed, and patient medications, allergies and                         sensitivities were reviewed. The patient's tolerance                         of previous anesthesia was reviewed.                        - The risks and benefits of the procedure and the                         sedation options and risks were discussed with the                         patient. All questions were answered and informed                         consent was obtained.                        After obtaining informed consent, the colonoscope was                         passed under direct vision. Throughout the procedure,                         the patient's blood pressure, pulse, and oxygen                         saturations were monitored continuously. The                         Colonoscope was introduced through the anus and                         advanced to the the cecum, identified by appendiceal                         orifice and ileocecal valve.  The colonoscopy was                         performed without difficulty. The patient tolerated                         the procedure well. The quality of the bowel                         preparation was excellent. Findings:      A 10 mm  polyp was found in the proximal ascending colon. The polyp was       sessile. The polyp was removed with a hot snare. Resection was complete,       but the polyp tissue was only partially retrieved.      A 5 mm polyp was found in the proximal transverse colon. The polyp was       sessile. The polyp was removed with a cold snare. Resection and       retrieval were complete.      A 5 mm polyp was found in the distal transverse colon. The polyp was       sessile. Biopsies were taken with a cold forceps for histology.      A 5 mm polyp was found in the descending colon. The polyp was sessile.       Biopsies were taken with a cold forceps for histology.      The retroflexed view of the distal rectum and anal verge was normal and       showed no anal or rectal abnormalities. Impression:            - One 10 mm polyp in the proximal ascending colon,                         removed with a hot snare. Complete resection. Partial                         retrieval.                        - One 5 mm polyp in the proximal transverse colon,                         removed with a cold snare. Resected and retrieved.                        - One 5 mm polyp in the distal transverse colon.                         Biopsied.                        - One 5 mm polyp in the descending colon. Biopsied.                        - The distal rectum and anal verge are normal on                         retroflexion view. Recommendation:        - Telephone endoscopist for pathology results in 1  week. Procedure Code(s):     --- Professional ---                        [phone], Colonoscopy, flexible; with removal of                         tumor(s), polyp(s), or other lesion(s) by snare                         technique                        45380, 59, Colonoscopy, flexible; with biopsy, single                         or multiple Diagnosis Code(s):     --- Professional ---                         Z12.11, Encounter for screening for malignant neoplasm                         of colon                        K63.5, Polyp of colon CPT copyright 2019 American Medical Association. All rights reserved. The codes documented in this report are preliminary and upon coder review may  be revised to meet current compliance requirements. Robert Bellow, MD 01/10/2021 11:00:50 AM This report has been signed electronically. Number of Addenda: 0 Note Initiated On: 01/10/2021 10:19 AM Scope Withdrawal Time: 0 hours 24 minutes 31 seconds  Total Procedure Duration: 0 hours 26 minutes 39 seconds  Estimated Blood Loss:  Estimated blood loss: none.      [facility]

## 2021-01-10 NOTE — H&P (Signed)
Sonya Wade [id number] August 10, 1945     HPI:  Healthy 75 y/o woman for a screening colonoscopy.  Normal study in 2007, negative cologuard in 2019.  Tolerated prep well.   Medications Prior to Admission  Medication Sig Dispense Refill Last Dose   aspirin 81 MG tablet Take 81 mg by mouth daily.   Past Week   atorvastatin (LIPITOR) 20 MG tablet Take 1 tablet (20 mg total) by mouth at bedtime. 90 tablet 3 Past Week   Cholecalciferol 1000 UNITS tablet Take 1,000 Units by mouth daily.    Past Week   escitalopram (LEXAPRO) 5 MG tablet Take 1 tablet (5 mg total) by mouth daily. 90 tablet 1 Past Week   fluticasone (FLONASE) 50 MCG/ACT nasal spray Place 2 sprays into both nostrils daily. 16 g 5 Past Week   folic acid (FOLVITE) A999333 MCG tablet Take 400 mcg daily by mouth.   Past Week   loratadine (CLARITIN) 10 MG tablet Take 1 tablet (10 mg total) by mouth daily. 30 tablet 5 Past Week   Multiple Vitamin (MULTIVITAMIN) tablet Take 1 tablet by mouth daily.   Past Week   Omega-3 Fatty Acids (FISH OIL PO) Take 1,400 mg by mouth 2 (two) times daily.    Past Week   No Known Allergies Past Medical History:  Diagnosis Date   Allergy    Arthritis    Benign neoplasm of breast 2011   Diffuse cystic mastopathy    Family history of malignant neoplasm of breast    Hyperlipidemia    Lump or mass in breast    Past Surgical History:  Procedure Laterality Date   BREAST BIOPSY Left 2013   core, apocine cyst. S marker   BREAST BIOPSY Left 02/04/2017   Dr. Jamal Collin BX of 10:00 mass. R marker. INFLAMED BREAST PARENCHYMA WITH FIBROSIS   BREAST CYST ASPIRATION Left 08/13/2016   FNA of 10:00 cyst done by DR. Sankar   BREAST EXCISIONAL BIOPSY Left 2005   intraductal papilloma   CATARACT EXTRACTION W/PHACO Right 04/02/2017   Procedure: CATARACT EXTRACTION PHACO AND INTRAOCULAR LENS PLACEMENT ([place]);  Surgeon: Birder Robson, MD;  Location: [facility] ORS;  Service: Ophthalmology;  Laterality: Right;  [place]  00:32 AP% 17.2 CDE 5.57 Fluid pack lot # [id number]   CATARACT EXTRACTION W/PHACO Left 04/22/2017   Procedure: CATARACT EXTRACTION PHACO AND INTRAOCULAR LENS PLACEMENT ([place]);  Surgeon: Birder Robson, MD;  Location: [facility] ORS;  Service: Ophthalmology;  Laterality: Left;  [place]  00:36 AP% 13.6 CDE 4.95 Fluid pack lot # [id number]   COLONOSCOPY  09/2006   Dr. Jamal Collin   EYE SURGERY     TUBAL LIGATION  1980   Social History   Socioeconomic History   Marital status: Married    Spouse name: Winferd Humphrey   Number of children: 1   Years of education: 12   Highest education level: High school graduate  Occupational History   Not on file  Tobacco Use   Smoking status: Never   Smokeless tobacco: Never  Vaping Use   Vaping Use: Never used  Substance and Sexual Activity   Alcohol use: No   Drug use: No   Sexual activity: Yes  Other Topics Concern   Not on file  Social History Narrative   Not on file   Social Determinants of Health   Financial Resource Strain: Not on file  Food Insecurity: Not on file  Transportation Needs: Not on file  Physical Activity: Not on file  Stress: Not on  file  Social Connections: Not on file  Intimate Partner Violence: Not on file   Social History   Social History Narrative   Not on file     ROS: Negative.     PE: HEENT: Negative. Lungs: Clear. Cardio: RR.  Assessment/Plan:  Proceed with planned endoscopy.   [facility] 01/10/2021

## 2021-01-10 NOTE — Transfer of Care (Signed)
Immediate Anesthesia Transfer of Care Note  Patient: Sonya Wade  Procedure(s) Performed: Procedure(s): COLONOSCOPY WITH PROPOFOL (N/A)  Patient Location: PACU and Endoscopy Unit  Anesthesia Type:General  Level of Consciousness: sedated  Airway & Oxygen Therapy: Patient Spontanous Breathing and Patient connected to nasal cannula oxygen  Post-op Assessment: Report given to RN and Post -op Vital signs reviewed and stable  Post vital signs: Reviewed and stable  Last Vitals:  Vitals:   01/10/21 0937 01/10/21 1102  BP: 128/83 120/72  Pulse: (!) 57   Resp: 18 11  Temp: (!) 36.4 C   SpO2: [id number]    Complications: No apparent anesthesia complications

## 2021-01-10 NOTE — Anesthesia Procedure Notes (Signed)
Date/Time: 01/10/2021 10:24 AM Performed by: Doreen Salvage, CRNA Pre-anesthesia Checklist: Patient identified, Emergency Drugs available, Suction available and Patient being monitored Patient Re-evaluated:Patient Re-evaluated prior to induction Oxygen Delivery Method: Nasal cannula Induction Type: IV induction Dental Injury: Teeth and Oropharynx as per pre-operative assessment  Comments: Nasal cannula with etCO2 monitoring

## 2021-01-11 ENCOUNTER — Encounter: Payer: Self-pay | Admitting: General Surgery

## 2021-01-11 LAB — SURGICAL PATHOLOGY

## 2021-01-11 NOTE — Anesthesia Postprocedure Evaluation (Signed)
Anesthesia Post Note  Patient: Sonya Wade  Procedure(s) Performed: COLONOSCOPY WITH PROPOFOL  Patient location during evaluation: Endoscopy Anesthesia Type: General Level of consciousness: awake and alert Pain management: pain level controlled Vital Signs Assessment: post-procedure vital signs reviewed and stable Respiratory status: spontaneous breathing, nonlabored ventilation and respiratory function stable Cardiovascular status: blood pressure returned to baseline and stable Postop Assessment: no apparent nausea or vomiting Anesthetic complications: no   No notable events documented.   Last Vitals:  Vitals:   01/10/21 1112 01/10/21 1122  BP: 130/69 (!) 150/68  Pulse: (!) 55 (!) 51  Resp: 16 12  Temp:    SpO2: 100% 99%    Last Pain:  Vitals:   01/10/21 1122  TempSrc:   PainSc: 0-No pain                 [place] Ouch

## 2021-02-14 DIAGNOSIS — E7800 Pure hypercholesterolemia, unspecified: Secondary | ICD-10-CM | POA: Diagnosis not present

## 2021-02-14 DIAGNOSIS — R7303 Prediabetes: Secondary | ICD-10-CM | POA: Diagnosis not present

## 2021-02-21 DIAGNOSIS — E7800 Pure hypercholesterolemia, unspecified: Secondary | ICD-10-CM | POA: Diagnosis not present

## 2021-02-21 DIAGNOSIS — Z0000 Encounter for general adult medical examination without abnormal findings: Secondary | ICD-10-CM | POA: Diagnosis not present

## 2021-02-21 DIAGNOSIS — E119 Type 2 diabetes mellitus without complications: Secondary | ICD-10-CM | POA: Diagnosis not present

## 2021-02-21 DIAGNOSIS — Z780 Asymptomatic menopausal state: Secondary | ICD-10-CM | POA: Diagnosis not present

## 2021-06-09 DIAGNOSIS — R350 Frequency of micturition: Secondary | ICD-10-CM | POA: Diagnosis not present

## 2021-06-12 DIAGNOSIS — M8588 Other specified disorders of bone density and structure, other site: Secondary | ICD-10-CM | POA: Diagnosis not present

## 2021-08-15 DIAGNOSIS — E7800 Pure hypercholesterolemia, unspecified: Secondary | ICD-10-CM | POA: Diagnosis not present

## 2021-08-15 DIAGNOSIS — E785 Hyperlipidemia, unspecified: Secondary | ICD-10-CM | POA: Diagnosis not present

## 2021-08-15 DIAGNOSIS — E1169 Type 2 diabetes mellitus with other specified complication: Secondary | ICD-10-CM | POA: Diagnosis not present

## 2021-08-23 ENCOUNTER — Ambulatory Visit
Admission: RE | Admit: 2021-08-23 | Discharge: 2021-08-23 | Disposition: A | Discharge status: Home / Self Care | Payer: Medicare HMO | Source: Ambulatory Visit | Attending: Internal Medicine | Admitting: Internal Medicine

## 2021-08-23 ENCOUNTER — Other Ambulatory Visit: Payer: Self-pay | Admitting: Internal Medicine

## 2021-08-23 ENCOUNTER — Other Ambulatory Visit (HOSPITAL_COMMUNITY): Payer: Self-pay | Admitting: Internal Medicine

## 2021-08-23 DIAGNOSIS — M25462 Effusion, left knee: Secondary | ICD-10-CM | POA: Insufficient documentation

## 2021-08-23 DIAGNOSIS — M7989 Other specified soft tissue disorders: Secondary | ICD-10-CM | POA: Diagnosis not present

## 2021-08-23 DIAGNOSIS — M79662 Pain in left lower leg: Secondary | ICD-10-CM | POA: Diagnosis not present

## 2021-08-23 DIAGNOSIS — M1712 Unilateral primary osteoarthritis, left knee: Secondary | ICD-10-CM | POA: Diagnosis not present

## 2021-08-23 DIAGNOSIS — M1990 Unspecified osteoarthritis, unspecified site: Secondary | ICD-10-CM | POA: Diagnosis not present

## 2021-08-23 DIAGNOSIS — M25562 Pain in left knee: Secondary | ICD-10-CM | POA: Diagnosis not present

## 2021-08-23 DIAGNOSIS — M7122 Synovial cyst of popliteal space [Baker], left knee: Secondary | ICD-10-CM | POA: Diagnosis not present

## 2021-08-23 DIAGNOSIS — R600 Localized edema: Secondary | ICD-10-CM | POA: Diagnosis not present

## 2021-08-23 DIAGNOSIS — M25469 Effusion, unspecified knee: Secondary | ICD-10-CM | POA: Diagnosis not present

## 2021-08-29 DIAGNOSIS — R69 Illness, unspecified: Secondary | ICD-10-CM | POA: Diagnosis not present

## 2021-08-29 DIAGNOSIS — M1712 Unilateral primary osteoarthritis, left knee: Secondary | ICD-10-CM | POA: Diagnosis not present

## 2021-08-29 DIAGNOSIS — E1169 Type 2 diabetes mellitus with other specified complication: Secondary | ICD-10-CM | POA: Diagnosis not present

## 2021-08-29 DIAGNOSIS — E7800 Pure hypercholesterolemia, unspecified: Secondary | ICD-10-CM | POA: Diagnosis not present

## 2021-08-29 DIAGNOSIS — Z6831 Body mass index (BMI) 31.0-31.9, adult: Secondary | ICD-10-CM | POA: Diagnosis not present

## 2021-08-29 DIAGNOSIS — F419 Anxiety disorder, unspecified: Secondary | ICD-10-CM | POA: Diagnosis not present

## 2021-08-29 DIAGNOSIS — E6609 Other obesity due to excess calories: Secondary | ICD-10-CM | POA: Diagnosis not present

## 2021-08-29 DIAGNOSIS — F32A Depression, unspecified: Secondary | ICD-10-CM | POA: Diagnosis not present

## 2021-09-07 DIAGNOSIS — M1712 Unilateral primary osteoarthritis, left knee: Secondary | ICD-10-CM | POA: Diagnosis not present

## 2021-10-26 DIAGNOSIS — J019 Acute sinusitis, unspecified: Secondary | ICD-10-CM | POA: Diagnosis not present

## 2021-10-26 DIAGNOSIS — U071 COVID-19: Secondary | ICD-10-CM | POA: Diagnosis not present

## 2021-10-26 DIAGNOSIS — Z03818 Encounter for observation for suspected exposure to other biological agents ruled out: Secondary | ICD-10-CM | POA: Diagnosis not present

## 2021-10-26 DIAGNOSIS — B9689 Other specified bacterial agents as the cause of diseases classified elsewhere: Secondary | ICD-10-CM | POA: Diagnosis not present

## 2021-10-26 DIAGNOSIS — J209 Acute bronchitis, unspecified: Secondary | ICD-10-CM | POA: Diagnosis not present

## 2021-11-06 DIAGNOSIS — Z1231 Encounter for screening mammogram for malignant neoplasm of breast: Secondary | ICD-10-CM | POA: Diagnosis not present

## 2021-11-13 DIAGNOSIS — N6011 Diffuse cystic mastopathy of right breast: Secondary | ICD-10-CM | POA: Diagnosis not present

## 2021-11-13 DIAGNOSIS — Z1211 Encounter for screening for malignant neoplasm of colon: Secondary | ICD-10-CM | POA: Diagnosis not present

## 2021-11-13 DIAGNOSIS — N6012 Diffuse cystic mastopathy of left breast: Secondary | ICD-10-CM | POA: Diagnosis not present

## 2021-11-16 DIAGNOSIS — T1502XA Foreign body in cornea, left eye, initial encounter: Secondary | ICD-10-CM | POA: Diagnosis not present

## 2021-11-21 DIAGNOSIS — T1502XA Foreign body in cornea, left eye, initial encounter: Secondary | ICD-10-CM | POA: Diagnosis not present

## 2021-11-21 DIAGNOSIS — T1502XS Foreign body in cornea, left eye, sequela: Secondary | ICD-10-CM | POA: Diagnosis not present

## 2022-01-10 DIAGNOSIS — M1712 Unilateral primary osteoarthritis, left knee: Secondary | ICD-10-CM | POA: Diagnosis not present

## 2022-02-25 DIAGNOSIS — J019 Acute sinusitis, unspecified: Secondary | ICD-10-CM | POA: Diagnosis not present

## 2022-02-25 DIAGNOSIS — B9689 Other specified bacterial agents as the cause of diseases classified elsewhere: Secondary | ICD-10-CM | POA: Diagnosis not present

## 2022-02-25 DIAGNOSIS — E7800 Pure hypercholesterolemia, unspecified: Secondary | ICD-10-CM | POA: Diagnosis not present

## 2022-02-25 DIAGNOSIS — E1169 Type 2 diabetes mellitus with other specified complication: Secondary | ICD-10-CM | POA: Diagnosis not present

## 2022-02-25 DIAGNOSIS — H66002 Acute suppurative otitis media without spontaneous rupture of ear drum, left ear: Secondary | ICD-10-CM | POA: Diagnosis not present

## 2022-02-25 DIAGNOSIS — E785 Hyperlipidemia, unspecified: Secondary | ICD-10-CM | POA: Diagnosis not present

## 2022-03-04 DIAGNOSIS — E119 Type 2 diabetes mellitus without complications: Secondary | ICD-10-CM | POA: Diagnosis not present

## 2022-03-04 DIAGNOSIS — Z0000 Encounter for general adult medical examination without abnormal findings: Secondary | ICD-10-CM | POA: Diagnosis not present

## 2022-03-04 DIAGNOSIS — E669 Obesity, unspecified: Secondary | ICD-10-CM | POA: Diagnosis not present

## 2022-03-04 DIAGNOSIS — Z6831 Body mass index (BMI) 31.0-31.9, adult: Secondary | ICD-10-CM | POA: Diagnosis not present

## 2022-03-04 DIAGNOSIS — E785 Hyperlipidemia, unspecified: Secondary | ICD-10-CM | POA: Diagnosis not present

## 2022-03-12 DIAGNOSIS — J019 Acute sinusitis, unspecified: Secondary | ICD-10-CM | POA: Diagnosis not present

## 2022-04-04 DIAGNOSIS — J40 Bronchitis, not specified as acute or chronic: Secondary | ICD-10-CM | POA: Diagnosis not present

## 2022-04-04 DIAGNOSIS — Z03818 Encounter for observation for suspected exposure to other biological agents ruled out: Secondary | ICD-10-CM | POA: Diagnosis not present

## 2022-04-04 DIAGNOSIS — J9811 Atelectasis: Secondary | ICD-10-CM | POA: Diagnosis not present

## 2022-05-22 DIAGNOSIS — E785 Hyperlipidemia, unspecified: Secondary | ICD-10-CM | POA: Diagnosis not present

## 2022-05-22 DIAGNOSIS — E1169 Type 2 diabetes mellitus with other specified complication: Secondary | ICD-10-CM | POA: Diagnosis not present

## 2022-05-22 DIAGNOSIS — E7800 Pure hypercholesterolemia, unspecified: Secondary | ICD-10-CM | POA: Diagnosis not present

## 2022-05-27 DIAGNOSIS — R3915 Urgency of urination: Secondary | ICD-10-CM | POA: Diagnosis not present

## 2022-05-27 DIAGNOSIS — E119 Type 2 diabetes mellitus without complications: Secondary | ICD-10-CM | POA: Diagnosis not present

## 2022-05-27 DIAGNOSIS — E7800 Pure hypercholesterolemia, unspecified: Secondary | ICD-10-CM | POA: Diagnosis not present

## 2022-05-27 DIAGNOSIS — E1169 Type 2 diabetes mellitus with other specified complication: Secondary | ICD-10-CM | POA: Diagnosis not present

## 2022-05-27 DIAGNOSIS — Z6832 Body mass index (BMI) 32.0-32.9, adult: Secondary | ICD-10-CM | POA: Diagnosis not present

## 2022-05-27 DIAGNOSIS — E6609 Other obesity due to excess calories: Secondary | ICD-10-CM | POA: Diagnosis not present

## 2022-05-27 DIAGNOSIS — E785 Hyperlipidemia, unspecified: Secondary | ICD-10-CM | POA: Diagnosis not present

## 2022-05-27 DIAGNOSIS — R69 Illness, unspecified: Secondary | ICD-10-CM | POA: Diagnosis not present

## 2022-09-24 ENCOUNTER — Other Ambulatory Visit: Payer: Self-pay | Admitting: Surgery

## 2022-09-24 DIAGNOSIS — Z1231 Encounter for screening mammogram for malignant neoplasm of breast: Secondary | ICD-10-CM

## 2022-09-25 ENCOUNTER — Other Ambulatory Visit: Payer: Self-pay | Admitting: *Deleted

## 2022-09-25 ENCOUNTER — Inpatient Hospital Stay
Admission: RE | Admit: 2022-09-25 | Discharge: 2022-09-25 | Disposition: A | Discharge status: Home / Self Care | Payer: Self-pay | Source: Ambulatory Visit | Attending: Family Medicine | Admitting: Family Medicine

## 2022-09-25 DIAGNOSIS — Z1231 Encounter for screening mammogram for malignant neoplasm of breast: Secondary | ICD-10-CM

## 2023-10-10 ENCOUNTER — Other Ambulatory Visit: Payer: Self-pay | Admitting: Internal Medicine

## 2023-10-10 DIAGNOSIS — Z780 Asymptomatic menopausal state: Secondary | ICD-10-CM

## 2024-06-17 ENCOUNTER — Other Ambulatory Visit: Payer: Self-pay | Admitting: Internal Medicine

## 2024-06-17 DIAGNOSIS — Z13820 Encounter for screening for osteoporosis: Secondary | ICD-10-CM

## 2024-06-17 DIAGNOSIS — Z780 Asymptomatic menopausal state: Secondary | ICD-10-CM
